# Patient Record
Sex: Female | Born: 1965 | Race: Black or African American | Hispanic: No | Marital: Married | Smoking: Never smoker
Health system: Southern US, Community
[De-identification: ages and names within clinical notes are randomized; demographics above are authoritative.]

---

## 1998-02-04 ENCOUNTER — Inpatient Hospital Stay (HOSPITAL_COMMUNITY): Admission: AD | Admit: 1998-02-04 | Discharge: 1998-02-04 | Payer: Self-pay | Admitting: *Deleted

## 1998-02-07 ENCOUNTER — Ambulatory Visit (HOSPITAL_COMMUNITY): Admission: RE | Admit: 1998-02-07 | Discharge: 1998-02-07 | Payer: Self-pay | Admitting: *Deleted

## 1998-06-10 ENCOUNTER — Inpatient Hospital Stay (HOSPITAL_COMMUNITY): Admission: AD | Admit: 1998-06-10 | Discharge: 1998-06-10 | Payer: Self-pay | Admitting: Obstetrics

## 1998-06-17 ENCOUNTER — Inpatient Hospital Stay (HOSPITAL_COMMUNITY): Admission: AD | Admit: 1998-06-17 | Discharge: 1998-06-17 | Payer: Self-pay | Admitting: *Deleted

## 1998-06-28 ENCOUNTER — Encounter: Payer: Self-pay | Admitting: Obstetrics

## 1998-06-28 ENCOUNTER — Inpatient Hospital Stay (HOSPITAL_COMMUNITY): Admission: AD | Admit: 1998-06-28 | Discharge: 1998-06-28 | Payer: Self-pay | Admitting: Obstetrics

## 1998-07-11 ENCOUNTER — Inpatient Hospital Stay (HOSPITAL_COMMUNITY): Admission: AD | Admit: 1998-07-11 | Discharge: 1998-07-11 | Payer: Self-pay | Admitting: *Deleted

## 1998-07-12 ENCOUNTER — Encounter: Admission: RE | Admit: 1998-07-12 | Discharge: 1998-07-12 | Payer: Self-pay | Admitting: Obstetrics & Gynecology

## 1998-08-09 ENCOUNTER — Encounter: Admission: RE | Admit: 1998-08-09 | Discharge: 1998-08-09 | Payer: Self-pay | Admitting: Obstetrics & Gynecology

## 1998-08-09 ENCOUNTER — Other Ambulatory Visit: Admission: RE | Admit: 1998-08-09 | Discharge: 1998-08-09 | Payer: Self-pay | Admitting: Obstetrics & Gynecology

## 1998-09-05 ENCOUNTER — Ambulatory Visit (HOSPITAL_COMMUNITY): Admission: RE | Admit: 1998-09-05 | Discharge: 1998-09-05 | Payer: Self-pay | Admitting: Obstetrics & Gynecology

## 1998-12-28 ENCOUNTER — Ambulatory Visit (HOSPITAL_COMMUNITY): Admission: RE | Admit: 1998-12-28 | Discharge: 1998-12-28 | Payer: Self-pay | Admitting: *Deleted

## 1999-01-12 ENCOUNTER — Inpatient Hospital Stay (HOSPITAL_COMMUNITY): Admission: AD | Admit: 1999-01-12 | Discharge: 1999-01-12 | Payer: Self-pay | Admitting: Obstetrics & Gynecology

## 1999-01-19 ENCOUNTER — Encounter (HOSPITAL_COMMUNITY): Admission: RE | Admit: 1999-01-19 | Discharge: 1999-01-29 | Payer: Self-pay | Admitting: *Deleted

## 1999-01-25 ENCOUNTER — Inpatient Hospital Stay (HOSPITAL_COMMUNITY): Admission: AD | Admit: 1999-01-25 | Discharge: 1999-01-28 | Payer: Self-pay | Admitting: Obstetrics & Gynecology

## 1999-02-28 ENCOUNTER — Encounter (HOSPITAL_COMMUNITY): Admission: RE | Admit: 1999-02-28 | Discharge: 1999-05-29 | Payer: Self-pay | Admitting: Obstetrics & Gynecology

## 2001-09-16 ENCOUNTER — Encounter: Admission: RE | Admit: 2001-09-16 | Discharge: 2001-09-16 | Payer: Self-pay | Admitting: Family Medicine

## 2001-10-21 ENCOUNTER — Encounter: Admission: RE | Admit: 2001-10-21 | Discharge: 2001-10-21 | Payer: Self-pay | Admitting: Family Medicine

## 2001-12-10 ENCOUNTER — Encounter: Admission: RE | Admit: 2001-12-10 | Discharge: 2001-12-10 | Payer: Self-pay | Admitting: Family Medicine

## 2002-03-15 ENCOUNTER — Emergency Department (HOSPITAL_COMMUNITY): Admission: EM | Admit: 2002-03-15 | Discharge: 2002-03-15 | Payer: Self-pay | Admitting: Emergency Medicine

## 2002-04-15 ENCOUNTER — Encounter: Admission: RE | Admit: 2002-04-15 | Discharge: 2002-04-15 | Payer: Self-pay | Admitting: Family Medicine

## 2002-05-31 ENCOUNTER — Encounter: Admission: RE | Admit: 2002-05-31 | Discharge: 2002-05-31 | Payer: Self-pay | Admitting: Family Medicine

## 2004-03-19 ENCOUNTER — Encounter (INDEPENDENT_AMBULATORY_CARE_PROVIDER_SITE_OTHER): Payer: Self-pay | Admitting: *Deleted

## 2004-03-19 LAB — CONVERTED CEMR LAB

## 2004-04-04 ENCOUNTER — Encounter: Admission: RE | Admit: 2004-04-04 | Discharge: 2004-04-04 | Payer: Self-pay | Admitting: Sports Medicine

## 2004-04-04 ENCOUNTER — Encounter (INDEPENDENT_AMBULATORY_CARE_PROVIDER_SITE_OTHER): Payer: Self-pay | Admitting: Specialist

## 2004-04-12 ENCOUNTER — Encounter: Admission: RE | Admit: 2004-04-12 | Discharge: 2004-04-12 | Payer: Self-pay | Admitting: Sports Medicine

## 2004-04-12 ENCOUNTER — Ambulatory Visit: Payer: Self-pay | Admitting: Sports Medicine

## 2004-11-07 ENCOUNTER — Ambulatory Visit: Payer: Self-pay | Admitting: Family Medicine

## 2005-02-27 ENCOUNTER — Ambulatory Visit: Payer: Self-pay | Admitting: Family Medicine

## 2005-08-15 ENCOUNTER — Ambulatory Visit: Payer: Self-pay | Admitting: Sports Medicine

## 2005-08-15 ENCOUNTER — Encounter (INDEPENDENT_AMBULATORY_CARE_PROVIDER_SITE_OTHER): Payer: Self-pay | Admitting: *Deleted

## 2006-10-17 ENCOUNTER — Encounter (INDEPENDENT_AMBULATORY_CARE_PROVIDER_SITE_OTHER): Payer: Self-pay | Admitting: *Deleted

## 2007-03-23 ENCOUNTER — Ambulatory Visit: Payer: Self-pay | Admitting: Family Medicine

## 2007-03-23 ENCOUNTER — Telehealth: Payer: Self-pay | Admitting: *Deleted

## 2007-03-23 DIAGNOSIS — H9319 Tinnitus, unspecified ear: Secondary | ICD-10-CM | POA: Insufficient documentation

## 2007-04-09 ENCOUNTER — Encounter: Payer: Self-pay | Admitting: *Deleted

## 2008-09-14 ENCOUNTER — Encounter (INDEPENDENT_AMBULATORY_CARE_PROVIDER_SITE_OTHER): Payer: Self-pay | Admitting: Family Medicine

## 2008-09-14 ENCOUNTER — Ambulatory Visit: Payer: Self-pay | Admitting: Family Medicine

## 2008-09-14 DIAGNOSIS — N920 Excessive and frequent menstruation with regular cycle: Secondary | ICD-10-CM

## 2008-09-15 ENCOUNTER — Encounter (INDEPENDENT_AMBULATORY_CARE_PROVIDER_SITE_OTHER): Payer: Self-pay | Admitting: Family Medicine

## 2008-09-15 DIAGNOSIS — D649 Anemia, unspecified: Secondary | ICD-10-CM

## 2008-09-16 LAB — CONVERTED CEMR LAB
HCT: 29 % — ABNORMAL LOW (ref 36.0–46.0)
Hemoglobin: 8.6 g/dL — ABNORMAL LOW (ref 12.0–15.0)
MCHC: 29.7 g/dL — ABNORMAL LOW (ref 30.0–36.0)
MCV: 68.2 fL — ABNORMAL LOW (ref 78.0–100.0)
RBC: 4.25 M/uL (ref 3.87–5.11)
WBC: 5.2 10*3/uL (ref 4.0–10.5)

## 2008-09-19 ENCOUNTER — Ambulatory Visit: Payer: Self-pay | Admitting: Family Medicine

## 2008-09-19 ENCOUNTER — Encounter (INDEPENDENT_AMBULATORY_CARE_PROVIDER_SITE_OTHER): Payer: Self-pay | Admitting: Family Medicine

## 2008-09-19 ENCOUNTER — Encounter: Payer: Self-pay | Admitting: *Deleted

## 2008-09-21 ENCOUNTER — Encounter (INDEPENDENT_AMBULATORY_CARE_PROVIDER_SITE_OTHER): Payer: Self-pay | Admitting: Family Medicine

## 2008-09-21 LAB — CONVERTED CEMR LAB
Retic Ct Pct: 1.3 % (ref 0.4–3.1)
Saturation Ratios: 5 % — ABNORMAL LOW (ref 20–55)

## 2008-09-23 ENCOUNTER — Encounter: Admission: RE | Admit: 2008-09-23 | Discharge: 2008-09-23 | Payer: Self-pay | Admitting: Family Medicine

## 2008-09-27 ENCOUNTER — Encounter: Payer: Self-pay | Admitting: *Deleted

## 2008-09-27 DIAGNOSIS — D259 Leiomyoma of uterus, unspecified: Secondary | ICD-10-CM | POA: Insufficient documentation

## 2009-01-02 ENCOUNTER — Emergency Department (HOSPITAL_COMMUNITY): Admission: EM | Admit: 2009-01-02 | Discharge: 2009-01-02 | Payer: Self-pay | Admitting: Family Medicine

## 2009-12-22 IMAGING — US US PELVIS COMPLETE
1 series · 14 of 25 positions shown · non-contrast
Comparison: None

CLINICAL DATA: Menorrhagia

TRANSABDOMINAL AND TRANSVAGINAL ULTRASOUND OF PELVIS
TECHNIQUE: Both transabdominal and transvaginal ultrasound
examinations of the pelvis were performed including evaluation of
the uterus, ovaries, adnexal regions, and pelvic cul-de-sac.

[Series 1: us pelvis complete · 0.41mm/px · 14 of 80 slices shown]
[im 1/80]
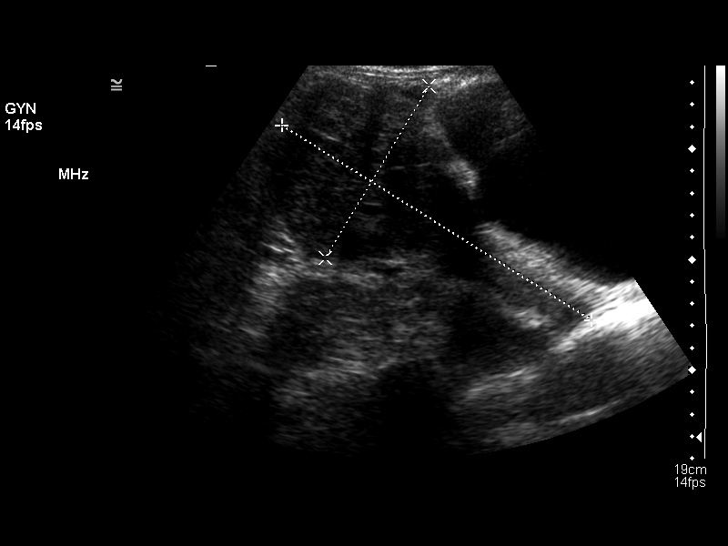
[im 7/80]
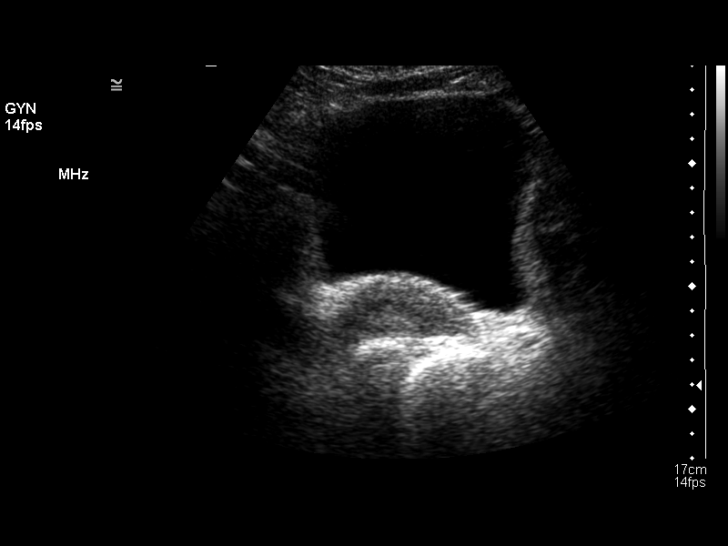
[im 14/80]
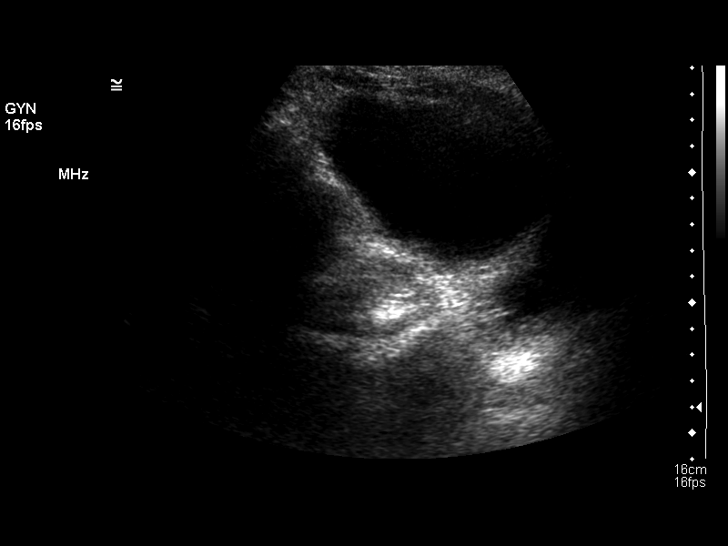
[im 20/80]
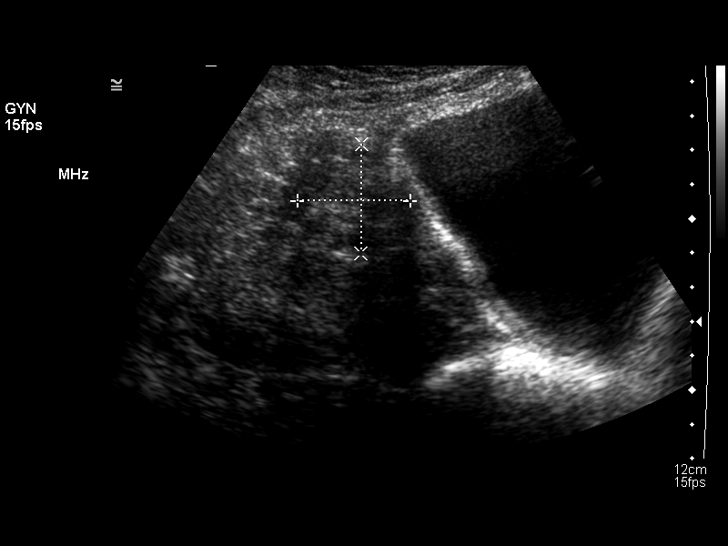
[im 27/80]
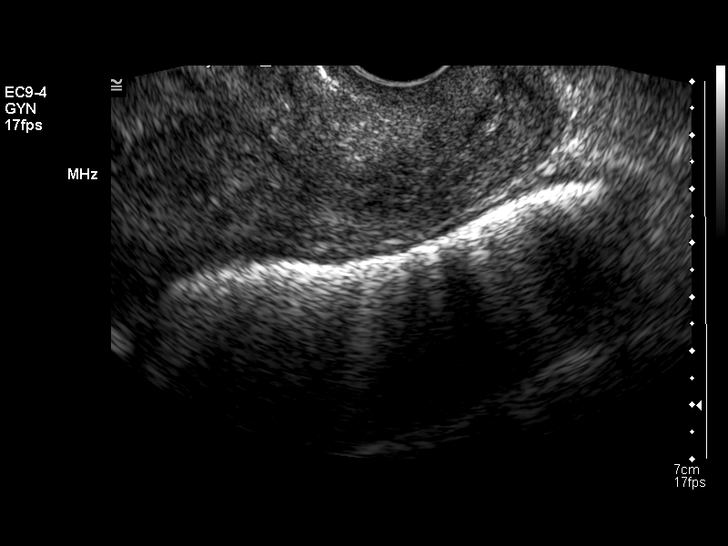
[im 30/80]
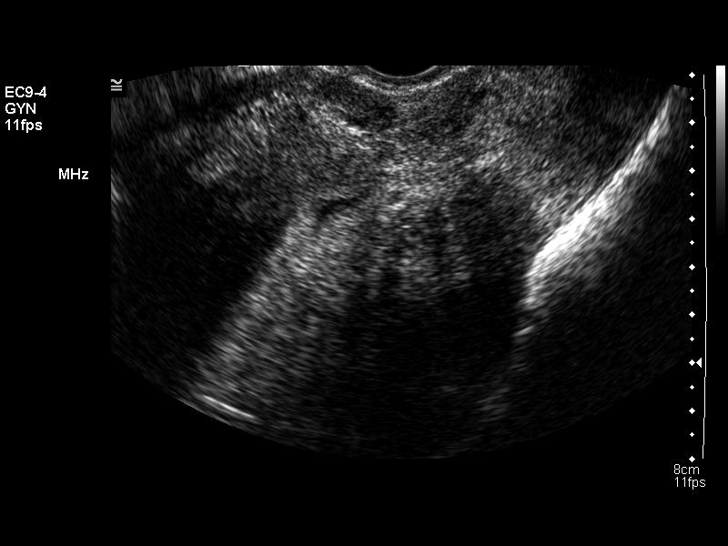
[im 37/80]
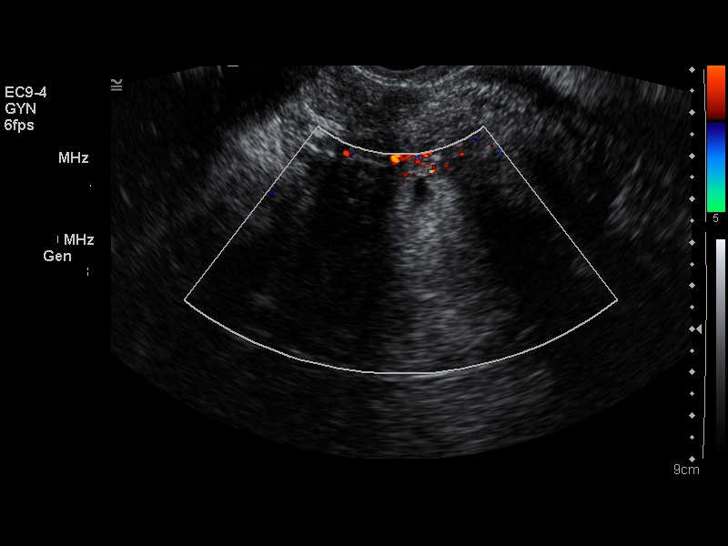
[im 43/80]
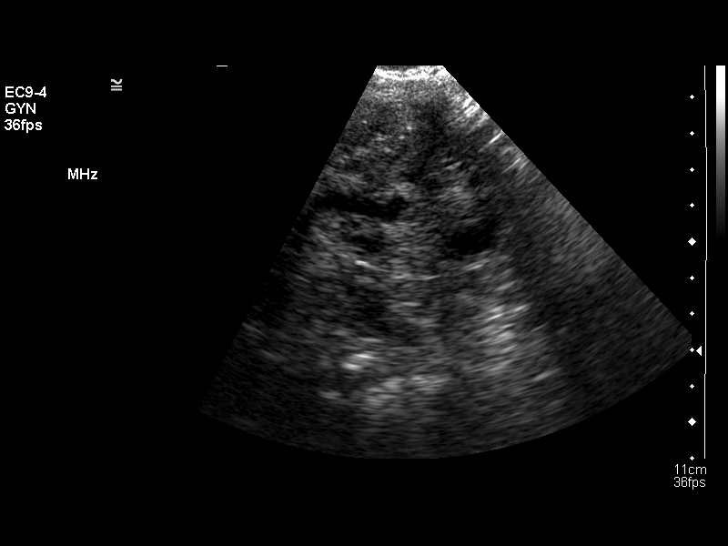
[im 50/80]
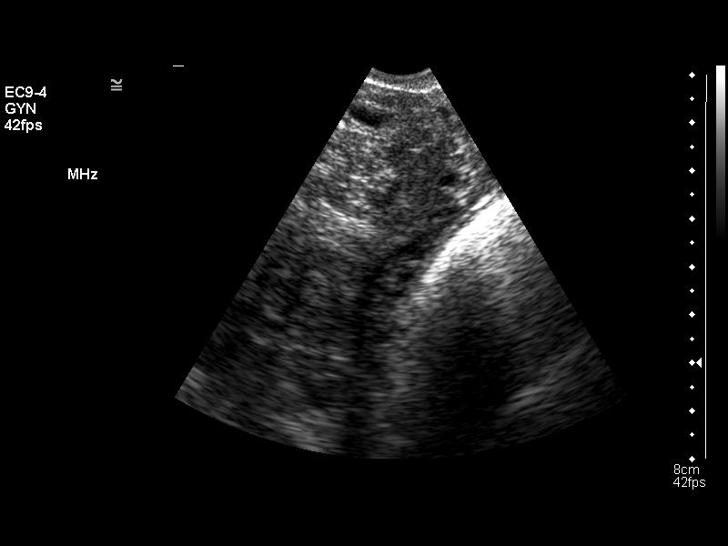
[im 53/80]
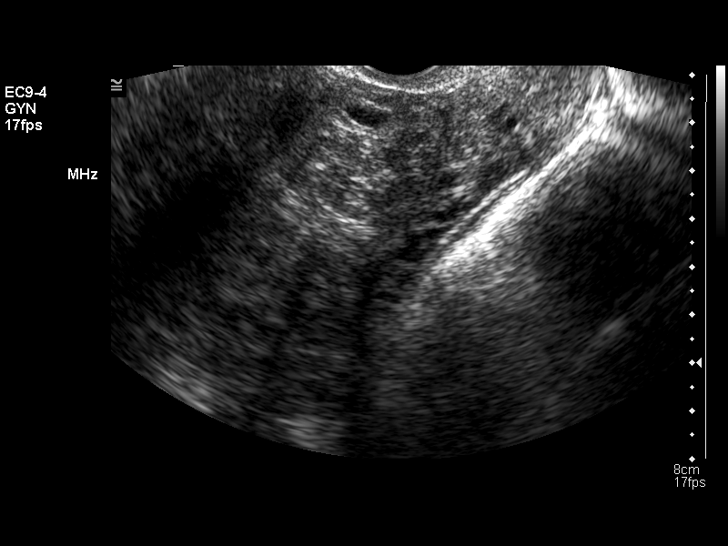
[im 60/80]
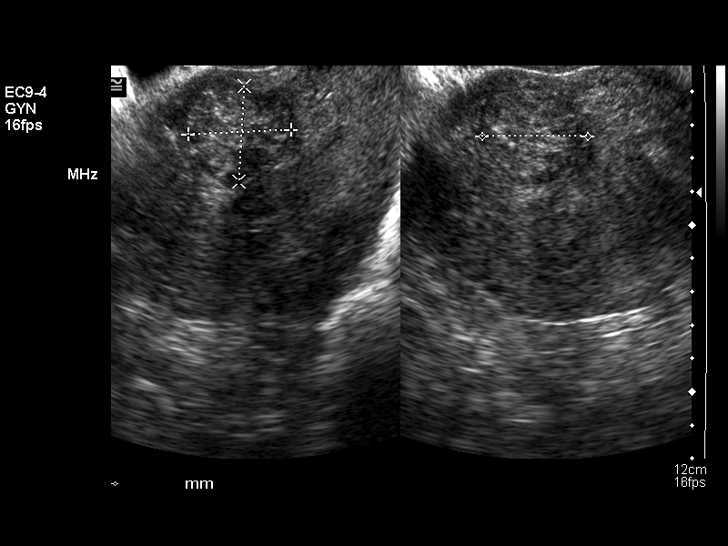
[im 66/80]
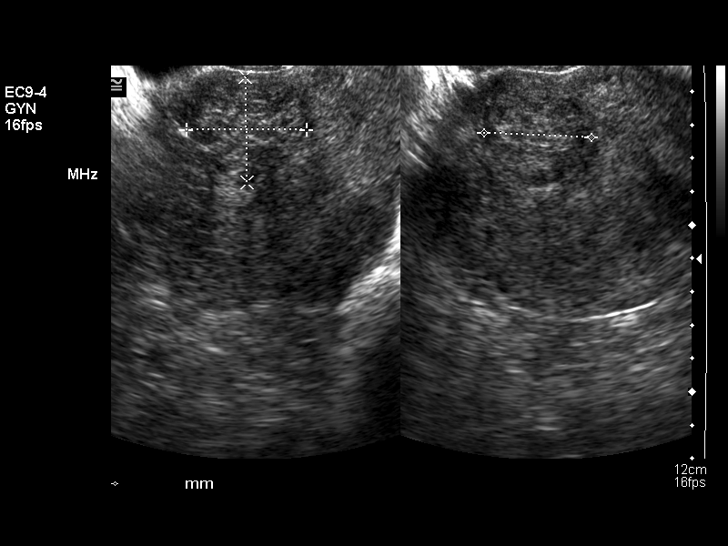
[im 73/80]
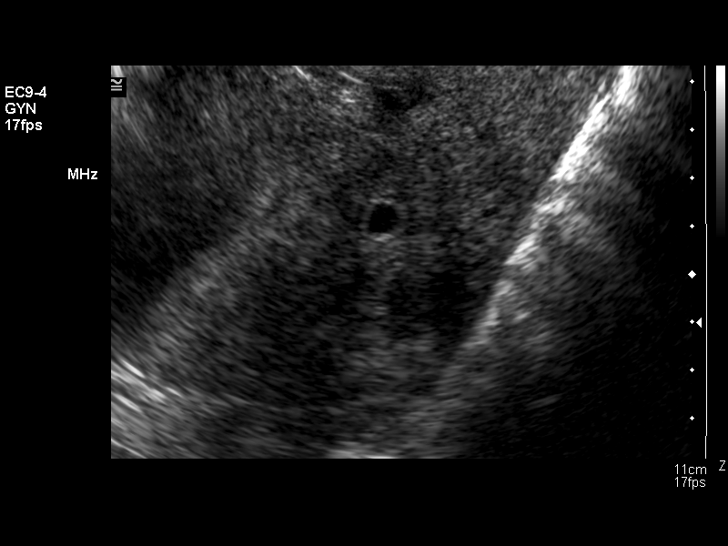
[im 80/80]
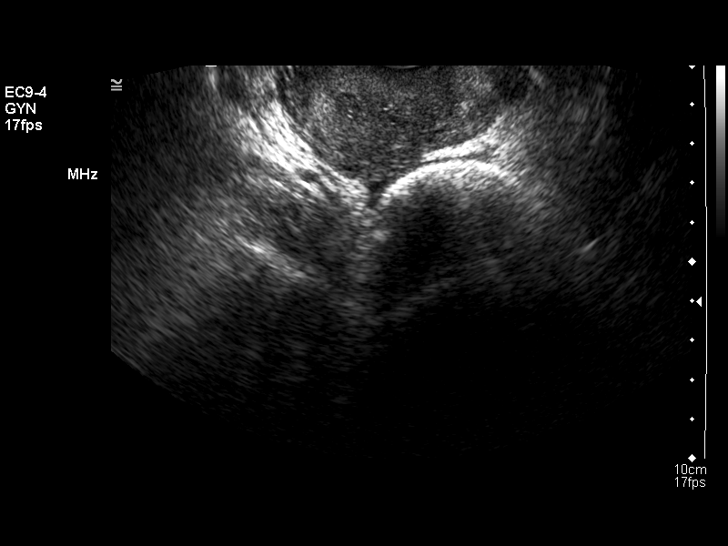

[14 of 25 positions shown; findings below may reference images not displayed]

FINDINGS: Uterus is enlarged, measuring 16.5 x 9.1 x 11.3 cm.
Diffuse heterogeneity noted throughout the endometrium.  Numerous
fibroids present throughout the uterus.  The largest is in the
right lower uterus measuring up to 3.6 cm.  The majority of the
other fibroids are in the 2-3 cm range.  Endometrium upper limits
normal in thickness at 14 mm.

Ovaries are symmetric in size and echotexture.  No adnexal masses.
No free fluid.
IMPRESSION: Enlarged heterogeneous uterus with numerous fibroids as described
above.  The diffuse heterogeneity throughout the uterus also raises
the possibility of adenomyosis.

REF:G3 DICTATED: 09/23/2008 [DATE]

## 2010-01-05 ENCOUNTER — Other Ambulatory Visit: Admission: RE | Admit: 2010-01-05 | Discharge: 2010-01-05 | Payer: Self-pay | Admitting: Obstetrics and Gynecology

## 2010-01-09 ENCOUNTER — Encounter: Admission: RE | Admit: 2010-01-09 | Discharge: 2010-01-09 | Payer: Self-pay | Admitting: Obstetrics and Gynecology

## 2010-01-22 ENCOUNTER — Encounter: Admission: RE | Admit: 2010-01-22 | Discharge: 2010-01-22 | Payer: Self-pay | Admitting: Obstetrics and Gynecology

## 2010-02-27 ENCOUNTER — Ambulatory Visit (HOSPITAL_COMMUNITY): Admission: RE | Admit: 2010-02-27 | Discharge: 2010-02-27 | Payer: Self-pay | Admitting: Diagnostic Radiology

## 2010-03-02 ENCOUNTER — Ambulatory Visit (HOSPITAL_COMMUNITY): Admission: RE | Admit: 2010-03-02 | Discharge: 2010-03-03 | Payer: Self-pay | Admitting: Diagnostic Radiology

## 2010-09-09 ENCOUNTER — Encounter: Payer: Self-pay | Admitting: Diagnostic Radiology

## 2010-09-19 ENCOUNTER — Encounter: Payer: Self-pay | Admitting: *Deleted

## 2010-09-19 ENCOUNTER — Encounter: Payer: Self-pay | Admitting: Obstetrics and Gynecology

## 2010-09-19 ENCOUNTER — Encounter: Payer: Self-pay | Admitting: Diagnostic Radiology

## 2010-11-03 LAB — CBC
HCT: 37.9 % (ref 36.0–46.0)
Hemoglobin: 12.9 g/dL (ref 12.0–15.0)
MCHC: 34 g/dL (ref 30.0–36.0)
WBC: 4.7 10*3/uL (ref 4.0–10.5)

## 2010-11-03 LAB — PREGNANCY, URINE: Preg Test, Ur: NEGATIVE

## 2010-11-04 LAB — CREATININE, SERUM
Creatinine, Ser: 0.63 mg/dL (ref 0.4–1.2)
GFR calc Af Amer: >60 mL/min (ref >60)

## 2011-06-26 ENCOUNTER — Encounter (HOSPITAL_COMMUNITY): Payer: Self-pay | Admitting: *Deleted

## 2011-06-26 ENCOUNTER — Emergency Department (INDEPENDENT_AMBULATORY_CARE_PROVIDER_SITE_OTHER)
Admission: EM | Admit: 2011-06-26 | Discharge: 2011-06-26 | Disposition: A | Discharge status: Home / Self Care | Payer: 59 | Source: Home / Self Care | Attending: Family Medicine | Admitting: Family Medicine

## 2011-06-26 DIAGNOSIS — R232 Flushing: Secondary | ICD-10-CM

## 2011-06-26 DIAGNOSIS — N951 Menopausal and female climacteric states: Secondary | ICD-10-CM

## 2011-06-26 LAB — TSH: TSH: 1.008 u[IU]/mL (ref 0.350–4.500)

## 2011-06-26 LAB — POCT I-STAT, CHEM 8
BUN: 14 mg/dL (ref 6–23)
Calcium, Ion: 1.24 mmol/L (ref 1.12–1.32)
Chloride: 106 mEq/L (ref 96–112)
Glucose, Bld: 88 mg/dL (ref 70–99)
HCT: 38 % (ref 36.0–46.0)
TCO2: 26 mmol/L (ref 0–100)

## 2011-06-26 LAB — T4: T4, Total: 8 ug/dL (ref 5.0–12.5)

## 2011-06-26 NOTE — ED Provider Notes (Signed)
History     CSN: [id number] Arrival date & time: 06/26/2011  4:58 PM   First MD Initiated Contact with Patient 06/26/11 1707      Chief Complaint  Patient presents with  . Chills    pt with c/o chills/loss of appetite /headache/x monday   . Fatigue  . Headache    (Consider location/radiation/quality/duration/timing/severity/associated sxs/prior treatment) HPI Comments: Samantha Lloyd presents for evaluation of headaches, hot flashes, irritability, forgetfulness, dry mouth over the last few months, but worsening over the last few weeks. She is unsure of her last period, but is positive that she had one last month. She reports hot flashes in the morning as she is heading to work, and again at night prior to bedtime. She denies any other symptoms. No nausea, no vomiting, no abdominal pain, no change in bowel or bladder habits, no chest pain, no shortness of breath. No URI sx.   Patient is a 45 y.o. female presenting with headaches. The history is provided by the patient.  Headache The primary symptoms include headaches and memory loss. The symptoms began more than 1 week ago. The symptoms are unchanged.  The headache is not associated with neck stiffness.  Additional symptoms include irritability. Additional symptoms do not include neck stiffness.    History reviewed. No pertinent past medical history.  History reviewed. No pertinent past surgical history.  History reviewed. No pertinent family history.  History  Substance Use Topics  . Smoking status: Not on file  . Smokeless tobacco: Not on file  . Alcohol Use: Not on file    OB History    Grav Para Term Preterm Abortions TAB SAB Ect Mult Living                  Review of Systems  Constitutional: Positive for irritability.  HENT: Negative for neck stiffness.   Neurological: Positive for headaches.  Psychiatric/Behavioral: Positive for memory loss.    Allergies  Review of patient's allergies indicates no known  allergies.  Home Medications   Current Outpatient Rx  Name Route Sig Dispense Refill  . FERROUS SULFATE 325 (65 FE) MG PO TABS Oral Take 325 mg by mouth 2 (two) times daily.        BP 122/83  Pulse 86  Temp(Src) 98.5 F (36.9 C) (Oral)  Resp 18  SpO2 98%  Physical Exam  Constitutional: She is oriented to person, place, and time. She appears well-developed and well-nourished. No distress.  HENT:  Head: Normocephalic and atraumatic.  Eyes: EOM are normal. Pupils are equal, round, and reactive to light.  Neck: Normal range of motion. Neck supple.  Cardiovascular: Normal rate and regular rhythm.   Pulmonary/Chest: Effort normal and breath sounds normal.  Musculoskeletal: Normal range of motion.  Neurological: She is alert and oriented to person, place, and time.  Skin: Skin is warm and dry.  Psychiatric: Her behavior is normal. Thought content normal.    ED Course  Procedures (including critical care time)   Labs Reviewed  POCT I-STAT, CHEM 8  TSH  T4  I-STAT, CHEM 8   No results found.   No diagnosis found.    MDM  Labs reviewed: chem panel normal; thyroid panel pending.         Richardo Priest, MD 06/26/11 [phone]

## 2011-06-27 ENCOUNTER — Ambulatory Visit (INDEPENDENT_AMBULATORY_CARE_PROVIDER_SITE_OTHER): Payer: 59 | Admitting: Family Medicine

## 2011-06-27 ENCOUNTER — Encounter: Payer: Self-pay | Admitting: Family Medicine

## 2011-06-27 VITALS — BP 114/75 | HR 95 | Temp 98.1°F | Ht 63.0 in | Wt 180.0 lb

## 2011-06-27 DIAGNOSIS — R232 Flushing: Secondary | ICD-10-CM | POA: Insufficient documentation

## 2011-06-27 DIAGNOSIS — F329 Major depressive disorder, single episode, unspecified: Secondary | ICD-10-CM

## 2011-06-27 DIAGNOSIS — N951 Menopausal and female climacteric states: Secondary | ICD-10-CM

## 2011-06-27 DIAGNOSIS — Z23 Encounter for immunization: Secondary | ICD-10-CM

## 2011-06-27 DIAGNOSIS — F32A Depression, unspecified: Secondary | ICD-10-CM | POA: Insufficient documentation

## 2011-06-27 MED ORDER — FLUOXETINE HCL 20 MG PO CAPS: 20.0000 mg | ORAL_CAPSULE | Freq: Every day | ORAL | Status: DC

## 2011-06-27 NOTE — Progress Notes (Signed)
  Subjective:    Patient ID: Samantha Lloyd, female    DOB: February 03, 1966, 45 y.o.   MRN: [id number]  HPI 1. Hot flashes  Still having menses. Regular intervals. Normal amount of bleeding. She has a normal TSH and t4 level. No hair loss or fatigue. She does have weight gain, which is expected from lack of exercise and excess calorie intake. No fevers, chills, night sweats. Flashes come randomly and less for minutes. No rash, no recent infection.  2. Grief/Depression PHQ-9 20. No suicidal thoughts.  Patient has recently been off work for some time and is now back at work in a job she doesn't like. She recently separated from her spouse. She feels sadness from these events. She is has no hypersomnia or hyposomnia.    Review of Systems See HPI    Objective:   Physical Exam  Psychiatric: Her speech is normal and behavior is normal. Judgment and thought content normal. Her mood appears anxious. Her affect is labile. Her affect is not angry, not blunt and not inappropriate. Cognition and memory are normal. She exhibits a depressed mood.   Filed Vitals:   06/27/11 1102  BP: 114/75  Pulse: 95  Temp: 98.1 F (36.7 C)  TempSrc: Oral  Height: 5\' 3"  (1.6 m)  Weight: 180 lb (81.647 kg)  Heart - Regular rate and rhythm.  No murmurs, gallops or rubs.    Lungs:  Normal respiratory effort, chest expands symmetrically. Lungs are clear to auscultation, no crackles or wheezes.     Assessment & Plan:

## 2011-06-27 NOTE — Assessment & Plan Note (Signed)
PHq-9 20 No suicidal thoughts Grief related from recent stressors. Grief Counsellor Fluoxetine 20 mg daily. F/u two weeks.

## 2011-06-27 NOTE — Patient Instructions (Signed)
It was great to see you today!  Schedule an appointment to see me in 2 weeks.  Please see a Grief counselor in the next two weeks.

## 2011-06-27 NOTE — Assessment & Plan Note (Signed)
Perimenopausal Fluoxetine 20 mg Daily F/u in two weeks

## 2011-07-17 ENCOUNTER — Ambulatory Visit (INDEPENDENT_AMBULATORY_CARE_PROVIDER_SITE_OTHER): Payer: 59 | Admitting: Family Medicine

## 2011-07-17 DIAGNOSIS — N951 Menopausal and female climacteric states: Secondary | ICD-10-CM

## 2011-07-17 DIAGNOSIS — R232 Flushing: Secondary | ICD-10-CM

## 2011-07-17 DIAGNOSIS — F329 Major depressive disorder, single episode, unspecified: Secondary | ICD-10-CM

## 2011-07-17 MED ORDER — FLUOXETINE HCL 20 MG PO CAPS: 20.0000 mg | ORAL_CAPSULE | Freq: Every day | ORAL | Status: DC

## 2011-07-23 NOTE — Progress Notes (Signed)
  Subjective:    Patient ID: Samantha Lloyd, female    DOB: [birth year], 45 y.o.   MRN: [id number]  HPI 1. Depression:  Here to follow up.  Started on prozac approx 2 weeks ago.  Really likes being on the medication and feels like it is helping her quite a bit, however she often forgets doses and has not taken any over the past two days.  She states her situation at home is better and she has returned back to her spouse.  Unsure if she would like to continue on the medication.     Review of Systems     Objective:   Physical Exam  Constitutional: She appears well-developed and well-nourished. No distress.  Psychiatric: She has a normal mood and affect. Her speech is normal and behavior is normal. Judgment normal. She expresses no homicidal and no suicidal ideation. She expresses no suicidal plans and no homicidal plans.          Assessment & Plan:

## 2011-07-23 NOTE — Assessment & Plan Note (Signed)
Improved, although I'm not sure how much medication is improving her symptoms, since she is missing multiple doses and has not taken in the past two days.  She says she will continue on medication for now.  Will return if symptoms begin to worsen again.

## 2012-10-29 ENCOUNTER — Telehealth: Payer: Self-pay | Admitting: *Deleted

## 2012-10-29 DIAGNOSIS — R232 Flushing: Secondary | ICD-10-CM

## 2012-10-29 NOTE — Telephone Encounter (Signed)
Refill request from pharmacy for Prozac. Form placed in MD box.

## 2012-10-30 MED ORDER — FLUOXETINE HCL 20 MG PO CAPS: 20.0000 mg | ORAL_CAPSULE | Freq: Every day | ORAL | Status: DC

## 2014-07-08 ENCOUNTER — Ambulatory Visit (INDEPENDENT_AMBULATORY_CARE_PROVIDER_SITE_OTHER): Payer: 59 | Admitting: Family Medicine

## 2014-07-08 ENCOUNTER — Ambulatory Visit (INDEPENDENT_AMBULATORY_CARE_PROVIDER_SITE_OTHER): Payer: 59

## 2014-07-08 VITALS — BP 121/66 | HR 85 | Temp 98.1°F | Ht 63.5 in | Wt 193.1 lb

## 2014-07-08 DIAGNOSIS — J069 Acute upper respiratory infection, unspecified: Secondary | ICD-10-CM | POA: Insufficient documentation

## 2014-07-08 DIAGNOSIS — Z23 Encounter for immunization: Secondary | ICD-10-CM

## 2014-07-08 NOTE — Progress Notes (Signed)
   Subjective:    Patient ID: Samantha Lloyd, female    DOB: 11-12-1965, 48 y.o.   MRN: [id number]  HPI Patient presents for SDA, complaint of 3 days of increased chest congestion, nasal mucus and congestion, dry cough and dry mouth.  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  Went to work on [employer], Nov 18th feeling well, but by the end of the day developed sore throat, "sniffles".  Developed worsening nasal congestion since then, worse when laying down.  Also feeling that there's something in her throat, not painful but is like there is something that she cannot swallow or cough up.   Denies fevers or chills, no sweats. Eating less, mostly due to decreased sense of smell (due to congestion) and finds food less appetizing. No hemoptysis. No dysuria or changes in urinary habits. No constipation.   Social Hx: Never-smoker. Not around smokers that she knows of.    Review of Systems     Objective:   Physical Exam Well appearing, no apparent distress HEENT Neck supple, TMs clear with good cones of light bilaterally, no frontal or maxillary sinus tenderness. Clear oropharynx. Watery nasal discharge on mucus membranes of nose. Thyroid supple and non-nodular. No cervical adenopathy noted.  COR Regular S1S2, no extra sounds PULM Clear bilaterally, no rales or wheezes noted.  ABD Soft, nontender, nondistended.        Assessment & Plan:

## 2014-07-08 NOTE — Patient Instructions (Signed)
It was a pleasure to meet you today.  I believe your symptoms are related to a viral illness (cold).   As we discussed, I recommend the following:  1. Increased fluid intake.   2. Mucinex (guiafenesin) 600mg  tablets, take 1 to 2 tablets by mouth every 12 hours as needed (thins mucus, helps with cough).  3. Humidifier or steam from hot shower, to open nasal passages.  4. Nasal saline spray in nose to help clear out nasal passages.   5. Please be in touch if you find your symptoms worsen or do not resolve in the coming 3 to 5 days.

## 2014-09-28 ENCOUNTER — Other Ambulatory Visit (HOSPITAL_COMMUNITY)
Admission: RE | Admit: 2014-09-28 | Discharge: 2014-09-28 | Disposition: A | Discharge status: Home / Self Care | Payer: 59 | Source: Ambulatory Visit | Attending: Family Medicine | Admitting: Family Medicine

## 2014-09-28 ENCOUNTER — Encounter: Payer: Self-pay | Admitting: Family Medicine

## 2014-09-28 ENCOUNTER — Ambulatory Visit (INDEPENDENT_AMBULATORY_CARE_PROVIDER_SITE_OTHER): Payer: 59 | Admitting: Family Medicine

## 2014-09-28 VITALS — BP 121/83 | HR 82 | Temp 98.2°F | Ht 63.5 in | Wt 193.1 lb

## 2014-09-28 DIAGNOSIS — Z1151 Encounter for screening for human papillomavirus (HPV): Secondary | ICD-10-CM | POA: Insufficient documentation

## 2014-09-28 DIAGNOSIS — Z01419 Encounter for gynecological examination (general) (routine) without abnormal findings: Secondary | ICD-10-CM

## 2014-09-28 DIAGNOSIS — Z113 Encounter for screening for infections with a predominantly sexual mode of transmission: Secondary | ICD-10-CM | POA: Insufficient documentation

## 2014-09-28 DIAGNOSIS — Z124 Encounter for screening for malignant neoplasm of cervix: Secondary | ICD-10-CM

## 2014-09-28 DIAGNOSIS — Z0000 Encounter for general adult medical examination without abnormal findings: Secondary | ICD-10-CM

## 2014-09-28 LAB — CBC WITH DIFFERENTIAL/PLATELET
BASOS PCT: 2 % — AB (ref 0–1)
Basophils Absolute: 0.1 10*3/uL (ref 0.0–0.1)
EOS ABS: 0.1 10*3/uL (ref 0.0–0.7)
EOS PCT: 3 % (ref 0–5)
HEMATOCRIT: 38.7 % (ref 36.0–46.0)
Hemoglobin: 12.6 g/dL (ref 12.0–15.0)
Lymphocytes Relative: 40 % (ref 12–46)
Lymphs Abs: 1.6 10*3/uL (ref 0.7–4.0)
MCH: 27.8 pg (ref 26.0–34.0)
MCHC: 32.6 g/dL (ref 30.0–36.0)
MCV: 85.2 fL (ref 78.0–100.0)
MONOS PCT: 6 % (ref 3–12)
MPV: 12.1 fL (ref 8.6–12.4)
Monocytes Absolute: 0.2 10*3/uL (ref 0.1–1.0)
NEUTROS PCT: 49 % (ref 43–77)
Neutro Abs: 1.9 10*3/uL (ref 1.7–7.7)
PLATELETS: 263 10*3/uL (ref 150–400)
RBC: 4.54 MIL/uL (ref 3.87–5.11)
RDW: 14.2 % (ref 11.5–15.5)
WBC: 3.9 10*3/uL — ABNORMAL LOW (ref 4.0–10.5)

## 2014-09-28 LAB — COMPREHENSIVE METABOLIC PANEL
ALK PHOS: 99 U/L (ref 39–117)
ALT: 16 U/L (ref 0–35)
AST: 15 U/L (ref 0–37)
Albumin: 4.4 g/dL (ref 3.5–5.2)
BILIRUBIN TOTAL: 0.4 mg/dL (ref 0.2–1.2)
BUN: 11 mg/dL (ref 6–23)
CO2: 29 mEq/L (ref 19–32)
Calcium: 10.2 mg/dL (ref 8.4–10.5)
Chloride: 105 mEq/L (ref 96–112)
Creat: 0.64 mg/dL (ref 0.50–1.10)
GLUCOSE: 86 mg/dL (ref 70–99)
Potassium: 4.1 mEq/L (ref 3.5–5.3)
Sodium: 142 mEq/L (ref 135–145)
Total Protein: 7.2 g/dL (ref 6.0–8.3)

## 2014-09-28 LAB — HIV ANTIBODY (ROUTINE TESTING W REFLEX): HIV: NONREACTIVE

## 2014-09-28 LAB — RPR

## 2014-09-28 MED ORDER — VENLAFAXINE HCL ER 75 MG PO CP24: 75.0000 mg | ORAL_CAPSULE | Freq: Every day | ORAL | Status: DC

## 2014-09-28 NOTE — Assessment & Plan Note (Signed)
Periods slowing, approaching menopause hs been 6 months since last period labs

## 2014-09-28 NOTE — Assessment & Plan Note (Signed)
Continued hot flashes, would like to try effexor after discussion

## 2014-09-28 NOTE — Assessment & Plan Note (Signed)
Feelings of anhedonia and feelings of depression Try effexor given hot flashes

## 2014-09-28 NOTE — Progress Notes (Addendum)
Patient ID: Samantha Lloyd, female   DOB: 10-02-1965, 49 y.o.   MRN: [id number]   HPI  Patient presents today for well exam and follow-up  Annual exam Has not had but 2 or 3 periods in the last year, last one was in August, normal flow Section active with her husband I would like to be checked for STI's Exline no history of abnormal Pap smears  Depression Positive feelings of anhedonia and feelings of depression, some sleep abnormalities, would like to try medicines PHQ-9 score 13, somewhat difficult, no SI  Hot flashes Continues to have problems with hot flashes  Smoking status noted -  ROS: Per HPI  Objective: BP 121/83 mmHg  Pulse 82  Temp(Src) 98.2 F (36.8 C) (Oral)  Ht 5' 3.5" (1.613 m)  Wt 193 lb 1.6 oz (87.59 kg)  BMI 33.67 kg/m2  LMP  (LMP Unknown) Gen: NAD, alert, cooperative with exam HEENT: NCAT, nares clear BL, oropharynx clear CV: RRR, good S1/S2, no murmur Resp: CTABL, no wheezes, non-labored Abd: SNTND, BS present, no guarding or organomegaly Ext: No edema, warm Neuro: Alert and oriented, No gross deficits  GU: Premium normal appearance, vagina well rugated pink mucous membranes, cervix posterior on bimanual exam and not visualized with speculum, minimal to moderate thick white dc present, bimanual exam with no adnexal fullness or abnormalities palpated, no CMT  Assessment and plan:  Hot flashes Continued hot flashes, would like to try effexor after discussion    Leiomyoma of uterus Periods slowing, approaching menopause hs been 6 months since last period labs   Depression, acute Feelings of anhedonia and feelings of depression Try effexor given hot flashes   Well woman exam Well, no complaints No Hx of STI or abnormal pap Pap today Recommend mammo Labs and HIV    Orders Placed This Encounter  Procedures  . HIV antibody (with reflex)  . RPR  . CBC with Differential  . Comprehensive metabolic panel    Meds ordered this encounter   Medications  . venlafaxine XR (EFFEXOR XR) 75 MG 24 hr capsule    Sig: Take 1 capsule (75 mg total) by mouth daily with breakfast. 1/2 tab daily for first week then 1 pill daily    Dispense:  30 capsule    Refill:  3

## 2014-09-28 NOTE — Patient Instructions (Signed)
Great to see you!  Try out the new medicine, effexor, 1/2 pill daily for 1 week then 1 pill daily   Follow up in 2 weeks.

## 2014-09-28 NOTE — Assessment & Plan Note (Signed)
Well, no complaints No Hx of STI or abnormal pap Pap today Recommend mammo Labs and HIV

## 2014-10-03 LAB — CYTOLOGY - PAP

## 2014-10-08 ENCOUNTER — Telehealth: Payer: Self-pay | Admitting: Family Medicine

## 2014-10-08 NOTE — Telephone Encounter (Signed)
Called to let her know labs and pap smear were all normal.   Laroy Apple, MD [facility] Resident, PGY-3 10/08/2014, 2:18 PM

## 2014-10-25 ENCOUNTER — Ambulatory Visit: Payer: 59 | Admitting: Family Medicine

## 2015-02-24 ENCOUNTER — Encounter: Payer: Self-pay | Admitting: Family Medicine

## 2015-02-24 ENCOUNTER — Ambulatory Visit: Payer: 59 | Admitting: Family Medicine

## 2015-02-24 ENCOUNTER — Ambulatory Visit (INDEPENDENT_AMBULATORY_CARE_PROVIDER_SITE_OTHER): Payer: 59 | Admitting: Family Medicine

## 2015-02-24 VITALS — BP 127/69 | HR 89 | Temp 98.4°F | Wt 192.0 lb

## 2015-02-24 DIAGNOSIS — M545 Low back pain, unspecified: Secondary | ICD-10-CM | POA: Insufficient documentation

## 2015-02-24 MED ORDER — CYCLOBENZAPRINE HCL 10 MG PO TABS: 10.0000 mg | ORAL_TABLET | Freq: Three times a day (TID) | ORAL | Status: DC | PRN
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
## 2015-02-24 NOTE — Patient Instructions (Signed)
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 You can also use Ibuprofen as needed for the pain You can use ice and heat for the pain. You should start getting better by next week, if you are not, please come back in to see [place]. If you have bowel or bladder incontinence, numbness or tingling, or radiation of pain into your legs, please seek medical attention immediately.
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
## 2015-06-27 ENCOUNTER — Ambulatory Visit: Payer: 59 | Admitting: Podiatry

## 2015-07-27 ENCOUNTER — Ambulatory Visit (INDEPENDENT_AMBULATORY_CARE_PROVIDER_SITE_OTHER): Payer: 59 | Admitting: Podiatry

## 2015-07-27 ENCOUNTER — Ambulatory Visit (INDEPENDENT_AMBULATORY_CARE_PROVIDER_SITE_OTHER): Payer: 59

## 2015-07-27 ENCOUNTER — Telehealth: Payer: Self-pay | Admitting: *Deleted

## 2015-07-27 ENCOUNTER — Encounter: Payer: Self-pay | Admitting: Podiatry

## 2015-07-27 VITALS — BP 121/69 | HR 62 | Resp 12

## 2015-07-27 DIAGNOSIS — M2012 Hallux valgus (acquired), left foot: Secondary | ICD-10-CM

## 2015-07-27 DIAGNOSIS — M2042 Other hammer toe(s) (acquired), left foot: Secondary | ICD-10-CM

## 2015-07-27 NOTE — Progress Notes (Signed)
Subjective:    Patient ID: Samantha Lloyd, female    DOB: August 13, 1966, 49 y.o.   MRN: [id number]  HPI: She presents today with chief complaint of a painful forefoot left. She states that over the past several months she has no some worsening of pain around the first metatarsophalangeal joint with particular shoe gear. She states that it really doesn't seem for shoe gear she wears she still has pain certain shoes just result in more discomfort. She states that the there is swelling around the first metatarsophalangeal joint and it is red and painful. She states for last 2 months in particular she's noticed throbbing. She states she show also has some tenderness beneath the second metatarsophalangeal joint and on the third toe where it under laps the second toe. She denies any trauma and states that the under lapping of the toe has been present for many years and bunion just appears to be worsening recently. She states the right foot is similar but not as painful.    Review of Systems  All other systems reviewed and are negative.      Objective:   Physical Exam: 49 year old black female presents vital signs stable alert and oriented 3 in no apparent distress. Pulses are strongly palpable capillary fill time to digits one through 5 of the bilateral foot is immediate. Neurologic sensorium is intact per Semmes-Weinstein monofilament and vibratory sensation is intact. Deep tendon reflexes are brisk and equal bilateral. Muscle strength is +5/5 dorsiflexors plantar flexors and inverters everters all intrinsic musculature is intact. Orthopedic evaluation demonstrates hallux abductovalgus deformity with pain on range of motion of the first metatarsophalangeal joint particularly in range of motion. She also has pain on palpation of the medial dorsal cutaneous nerve as it passes over a very prominent hypertrophic medial condyle of the head of the first metatarsal. There is mild erythema overlying this area.  Mild fluctuance beneath the skin. I see no infection at this point. She also has pain on palpation and in range of motion of the second metatarsophalangeal joint. This toe is excessively long as she does have an under lapping or an adductovarus rotation of the DIPJ third digit left foot. This is moderately tender on palpation and appears to be in flexor contracture at the level of the PIPJ and DIPJ. Radiographic evaluation 3 views bilateral taken today in the office demonstrate hallux abductovalgus deformity bilateral left greater than right with an increase in the first intermetatarsal angle greater than normal value proximally 15 and hallux abductus angle of about 10. An elongated second metatarsal of approximately 1 cm greater than normal value. Adductor varus rotated hammertoe deformity third is noted bilaterally. Cutaneous evaluation demonstrates supple well-hydrated cutis with exception of the mild erythema overlying the first metatarsophalangeal joints bilaterally left seems to be worse than the right. No open lesions or wounds.        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 Plantarflexed elongated second metatarsal with an elongated toe without deformity left greater than right. 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  Plan: We discussed the etiology pathology conservative versus surgical therapies. At this point we consented her for an [facility] bunion repair with screw fixation, second metatarsal osteotomy shortening in nature with double screw fixation. And a hammertoe repair either an arthroplasty or an arthrodesis of the third digit left foot. We went over the consent form line by line number by number giving her ample  time to ask questions she saw fit regarding these procedures. We discussed the possible postop complications which may include but are not limited to postop pain bleeding  swelling infection recurrence overcorrection under correction need for further surgery loss of digit loss of limb loss of life development of a pulmonary embolism or a DVT. She signed all 3 pages of the consent form were discussed with human resources as to how long she can be out of work we did discuss at least 2 weeks at this point. I also suggested she received a [place] for her postop recovery period. I will follow-up with her in the near future.

## 2015-07-27 NOTE — Patient Instructions (Signed)
Pre-Operative Instructions  Congratulations, you have decided to take an important step to improving your quality of life.  You can be assured that the doctors of [facility] will be with you every step of the way.  1. Plan to be at the surgery center/hospital at least 1 (one) hour prior to your scheduled time unless otherwise directed by the surgical center/hospital staff.  You must have a responsible adult accompany you, remain during the surgery and drive you home.  Make sure you have directions to the surgical center/hospital and know how to get there on time. 2. For hospital based surgery you will need to obtain a history and physical form from your family physician within 1 month prior to the date of surgery- we will give you a form for you primary physician.  3. We make every effort to accommodate the date you request for surgery.  There are however, times where surgery dates or times have to be moved.  We will contact you as soon as possible if a change in schedule is required.   4. No Aspirin/Ibuprofen for one week before surgery.  If you are on aspirin, any non-steroidal anti-inflammatory medications (Mobic, Aleve, Ibuprofen) you should stop taking it 7 days prior to your surgery.  You make take Tylenol  For pain prior to surgery.  5. Medications- If you are taking daily heart and blood pressure medications, seizure, reflux, allergy, asthma, anxiety, pain or diabetes medications, make sure the surgery center/hospital is aware before the day of surgery so they may notify you which medications to take or avoid the day of surgery. 6. No food or drink after midnight the night before surgery unless directed otherwise by surgical center/hospital staff. 7. No alcoholic beverages 24 hours prior to surgery.  No smoking 24 hours prior to or 24 hours after surgery. 8. Wear loose pants or shorts- loose enough to fit over bandages, boots, and casts. 9. No slip on shoes, sneakers are best. 10. Bring  your boot with you to the surgery center/hospital.  Also bring crutches or a walker if your physician has prescribed it for you.  If you do not have this equipment, it will be provided for you after surgery. 11. If you have not been contracted by the surgery center/hospital by the day before your surgery, call to confirm the date and time of your surgery. 12. Leave-time from work may vary depending on the type of surgery you have.  Appropriate arrangements should be made prior to surgery with your employer. 13. Prescriptions will be provided immediately following surgery by your doctor.  Have these filled as soon as possible after surgery and take the medication as directed. 14. Remove nail polish on the operative foot. 15. Wash the night before surgery.  The night before surgery wash the foot and leg well with the antibacterial soap provided and water paying special attention to beneath the toenails and in between the toes.  Rinse thoroughly with water and dry well with a towel.  Perform this wash unless told not to do so by your physician.  Enclosed: 1 Ice pack (please put in freezer the night before surgery)   1 Hibiclens skin cleaner   Pre-op Instructions  If you have any questions regarding the instructions, do not hesitate to call our office.  [place]: [address] [place], [place] [address] [phone]  [place]: [address] [place], [place] [address] [phone]  [place]: [address]  [place], [place] [address] [phone]  Dr. Richard   Tuchman DPM, Dr. Norman Regal DPM Dr. Richard Sikora DPM, Dr. M. Todd Taiden Raybourn DPM, Dr. Kathryn Egerton DPM 

## 2015-07-27 NOTE — Telephone Encounter (Signed)
"  I was there today, I'd like to schedule my surgery."  He can do it on 08/18/2015.  "That date will be fine.  Am I in-network with the surgical center or should I call them?"  What insurance do you have?  "I have [employer].  I think they are but you may want to call them to make sure.  "Okay, I will.  Is there anything else I need to do?"  Register with the surgical center, instructions are in the surgical center brochure that we gave you.  They will call you a day or two prior to surgery date with the arrival time.  "Okay, thank you so much."

## 2015-08-14 ENCOUNTER — Other Ambulatory Visit: Payer: Self-pay | Admitting: Podiatry

## 2015-08-14 MED ORDER — CEPHALEXIN 500 MG PO CAPS: 500.0000 mg | ORAL_CAPSULE | Freq: Three times a day (TID) | ORAL | Status: DC

## 2015-08-14 MED ORDER — OXYCODONE-ACETAMINOPHEN 10-325 MG PO TABS: ORAL_TABLET | ORAL | Status: DC

## 2015-08-14 MED ORDER — PROMETHAZINE HCL 25 MG PO TABS: 25.0000 mg | ORAL_TABLET | Freq: Three times a day (TID) | ORAL | Status: DC | PRN

## 2015-08-17 ENCOUNTER — Telehealth: Payer: Self-pay | Admitting: *Deleted

## 2015-08-17 NOTE — Telephone Encounter (Signed)
"  I was told I needed to call you by the surgical center about my surgery scheduled for tomorrow.  They called and told me to be there at 7am.  I can't be there that early.  At first I was scheduled for 3pm and they changed that time.  Then I received a call and they changed it to 12:30pm.  Do they not want to do my surgery?"  No, Dr. Milinda Pointer just wanted to do the bigger cases first.  "Oh no, why did you tell me that?  Now I am worried.  Why would he do that, why are the other cases not as long as mine?  I'm getting scared!  "No, they're having different procedures or only one procedure and doesn't take as long.  I'll call them and tell them to leave you where you are at.  "Will they call me back with the time?"  Yes, I will call them and let them know.    I called and spoke to [place] at [facility].  I asked her to leave patient where she is and to call and let her know.

## 2015-08-17 NOTE — Telephone Encounter (Signed)
"  I want to cancel my surgery scheduled for tomorrow.  I think I"m going to wait until March when my daughter will be home on Spring Break."  Would you like to reschedule it?  "I do but I'm not sure of the date.  I will call you back when it gets closer to March.

## 2015-08-17 NOTE — Telephone Encounter (Signed)
These cancellations totally screwed up my surgery schedule.  I could have done all of these on Friday and not had to miss clinic.  Maybe we should impose a cancellation fee if it is last minute.  She should have known that she was not going to do this before today.  Thanks Delydia.

## 2015-08-18 ENCOUNTER — Telehealth: Payer: Self-pay | Admitting: *Deleted

## 2015-08-18 NOTE — Telephone Encounter (Signed)
"  I thought about it all night.  Is there anyway possible I can still have my surgery done?  If I have it done now it'll probably be healed by March."  I don't know, I'll have to call the surgical center to find out.  I'll call you back.  I attempted to call Renee at [facility].  She had not arrived yet.  I will call her back.  "This is Renee.  I got your message.  Dr. Milinda Pointer said no, it can't be done today.  We have shredded all her information so if she reschedules, can you send me the information again?"  Okay, I will.  Unfortunately they are not able to add you back on for today at the surgical center.  "Okay, I can't even get mad.  It is my fault.  What is the soonest he has available?"  He has an availability on 09/01/2015.  "Okay, I'll can't do it then.  I'll just call back to reschedule."

## 2015-08-24 ENCOUNTER — Encounter: Payer: Self-pay | Admitting: Podiatry

## 2015-08-31 ENCOUNTER — Encounter: Payer: Self-pay | Admitting: Podiatry

## 2015-12-22 ENCOUNTER — Encounter: Payer: Self-pay | Admitting: Family Medicine

## 2015-12-22 ENCOUNTER — Ambulatory Visit (INDEPENDENT_AMBULATORY_CARE_PROVIDER_SITE_OTHER): Payer: 59 | Admitting: Family Medicine

## 2015-12-22 VITALS — BP 112/73 | HR 85 | Temp 98.3°F | Ht 64.0 in | Wt 169.0 lb

## 2015-12-22 DIAGNOSIS — R634 Abnormal weight loss: Secondary | ICD-10-CM | POA: Diagnosis not present

## 2015-12-22 DIAGNOSIS — L918 Other hypertrophic disorders of the skin: Secondary | ICD-10-CM | POA: Diagnosis not present

## 2015-12-22 LAB — BASIC METABOLIC PANEL WITH GFR
BUN: 10 mg/dL (ref 7–25)
CALCIUM: 10.7 mg/dL — AB (ref 8.6–10.4)
CO2: 27 mmol/L (ref 20–31)
CREATININE: 0.72 mg/dL (ref 0.50–1.05)
Chloride: 103 mmol/L (ref 98–110)
GFR, Est African American: >89 mL/min (ref >=60)
GLUCOSE: 88 mg/dL (ref 65–99)
Potassium: 4.3 mmol/L (ref 3.5–5.3)
Sodium: 142 mmol/L (ref 135–146)

## 2015-12-22 LAB — LIPID PANEL
Cholesterol: 209 mg/dL — ABNORMAL HIGH (ref 125–200)
HDL: 54 mg/dL (ref >=46)
LDL CALC: 133 mg/dL — AB (ref <130)
TRIGLYCERIDES: 108 mg/dL (ref <150)
Total CHOL/HDL Ratio: 3.9 Ratio (ref <=5.0)
VLDL: 22 mg/dL (ref <30)

## 2015-12-22 LAB — TSH: TSH: 0.81 mIU/L

## 2015-12-22 NOTE — Assessment & Plan Note (Signed)
Patient has had some significant loss of weight over the past few months. She states that she has been trying to please weight recently but was wondering if she may be losing weight too fast. At this time I do not believe that she is losing weight at an unhealthy rate, has long as she is making an effort to lose that weight. - I will obtain some labs to ensure that there is no underlying problem at this time: CMP, TSH, lipid panel.

## 2015-12-22 NOTE — Assessment & Plan Note (Signed)
Patient had multiple skin tags noted on her upper chest and neck. These were removed as stated in the procedure note above. - Patient tolerated procedure well - Return precautions and signs of infection discussed with patient.

## 2015-12-22 NOTE — Progress Notes (Signed)
   HPI  CC: Weight loss and skin tag removal. Patient is here for a skin tag removal. She states that she has had some issues with some skin tags noted around her bra line which can occasionally get red and irritated, as well as a skin tag underneath her left arm which occasionally gets nicked when shaving and bleeds. She denies any bleeding or irritation to these sites at this time. She denies having any allergic reactions to any medications that she knows of.  Patient also states that she has had a significant amount of weight loss over the past year. She states that she has indeed been  Attempting to lose weight and is now down over 30 pounds. She is asking whether or not this is a safe amount of weight to of lost because she would like to continue in her attempts to lose more weight. She denies any anorexia or fatigue. She states that she has been significantly more active than she had been in the past. She works out about 5 days a week. She has altered her diet to be more healthy and balanced. She denies having any issues during any of her workouts. Ultimately, she states that she feels much better than she did this time last year.  ROS: Denies headache,vision changes, dizziness, vertigo, shortness of breath, chest pain, nausea, vomiting, diarrhea, dysuria, weakness, numbness, or paresthesias.  CC, SH/smoking status, and VS noted  Objective: BP 112/73 mmHg  Pulse 85  Temp(Src) 98.3 F (36.8 C) (Oral)  Ht 5\' 4"  (1.626 m)  Wt 169 lb (76.658 kg)  BMI 28.99 kg/m2 Gen: NAD, alert, cooperative, and pleasant. HEENT: NCAT, EOMI, PERRL CV: RRR, no murmur Resp:  non-labored Abd: SNTND, BS present, no guarding or organomegaly Integument: Multiple <4mm pedunculated, hyperpigmented skin lesions noted along her upper chest. No areas of erythema, drainage or bleeding.  Procedure note: skin tag removal  Location: under left breast (x4), left clavicle/upper chest (x2), left axillary (x1), Rt base of  neck (x4) Written informed consent given and received, including potential for scarring.  Area cleaned with alcohol swabs x 2, and then injected with 2% plain lidocaine with epinephrine.  Adequate anesthesia achieved.  Skin tags snipped with suture scissors.  Minimal bleeding.  Area covered with Band-Aid and steri strip .  Patient tolerated procedure very well   Assessment and plan:  Acrochordon Patient had multiple skin tags noted on her upper chest and neck. These were removed as stated in the procedure note above. - Patient tolerated procedure well - Return precautions and signs of infection discussed with patient.  Loss of weight Patient has had some significant loss of weight over the past few months. She states that she has been trying to please weight recently but was wondering if she may be losing weight too fast. At this time I do not believe that she is losing weight at an unhealthy rate, has long as she is making an effort to lose that weight. - I will obtain some labs to ensure that there is no underlying problem at this time: CMP, TSH, lipid panel.    Orders Placed This Encounter  Procedures  . BASIC METABOLIC PANEL WITH GFR  . TSH  . Lipid panel    Elberta Leatherwood, MD,MS,  PGY2 12/22/2015 5:57 PM

## 2015-12-22 NOTE — Patient Instructions (Signed)
Incision Care °An incision is when a surgeon cuts into your body. After surgery, the incision needs to be cared for properly to prevent infection.  °HOW TO CARE FOR YOUR INCISION °· Take medicines only as directed by your health care provider. °· There are many different ways to close and cover an incision, including stitches, skin glue, and adhesive strips. Follow your health care provider's instructions on: °¨ Incision care. °¨ Bandage (dressing) changes and removal. °¨ Incision closure removal. °· Do not take baths, swim, or use a hot tub until your health care provider approves. You may shower as directed by your health care provider. °· Resume your normal diet and activities as directed. °· Use anti-itch medicine (such as an antihistamine) as directed by your health care provider. The incision may itch while it is healing. Do not pick or scratch at the incision. °· Drink enough fluid to keep your urine clear or pale yellow. °SEEK MEDICAL CARE IF:  °· You have drainage, redness, swelling, or pain at your incision site. °· You have muscle aches, chills, or a general ill feeling. °· You notice a bad smell coming from the incision or dressing. °· Your incision edges separate after the sutures, staples, or skin adhesive strips have been removed. °· You have persistent nausea or vomiting. °· You have a fever. °· You are dizzy. °SEEK IMMEDIATE MEDICAL CARE IF:  °· You have a rash. °· You faint. °· You have difficulty breathing. °MAKE SURE YOU:  °· Understand these instructions. °· Will watch your condition. °· Will get help right away if you are not doing well or get worse. °  °This information is not intended to replace advice given to you by your health care provider. Make sure you discuss any questions you have with your health care provider. °  °Document Released: 02/22/2005 Document Revised: 08/26/2014 Document Reviewed: 09/29/2013 °Elsevier Interactive Patient Education ©2016 Elsevier Inc. ° °

## 2015-12-26 ENCOUNTER — Telehealth: Payer: Self-pay | Admitting: Family Medicine

## 2015-12-26 NOTE — Telephone Encounter (Signed)
Need to get information emailed to patient regarding her mammogrm.  Send to : [email or web address]

## 2015-12-27 ENCOUNTER — Encounter: Payer: Self-pay | Admitting: Family Medicine

## 2015-12-27 NOTE — Telephone Encounter (Signed)
Unable to email, mammogram form mailed to address in patient chart.

## 2016-01-03 ENCOUNTER — Other Ambulatory Visit: Payer: Self-pay

## 2016-01-03 DIAGNOSIS — Z1231 Encounter for screening mammogram for malignant neoplasm of breast: Secondary | ICD-10-CM

## 2016-01-19 ENCOUNTER — Other Ambulatory Visit: Payer: Self-pay | Admitting: Family Medicine

## 2016-01-19 ENCOUNTER — Ambulatory Visit
Admission: RE | Admit: 2016-01-19 | Discharge: 2016-01-19 | Disposition: A | Discharge status: Home / Self Care | Payer: 59 | Source: Ambulatory Visit

## 2016-01-19 DIAGNOSIS — Z1231 Encounter for screening mammogram for malignant neoplasm of breast: Secondary | ICD-10-CM

## 2016-02-21 ENCOUNTER — Ambulatory Visit (INDEPENDENT_AMBULATORY_CARE_PROVIDER_SITE_OTHER): Payer: 59 | Admitting: Internal Medicine

## 2016-02-21 ENCOUNTER — Encounter: Payer: Self-pay | Admitting: Internal Medicine

## 2016-02-21 VITALS — BP 102/60 | HR 72 | Temp 97.9°F | Wt 160.6 lb

## 2016-02-21 DIAGNOSIS — M25512 Pain in left shoulder: Secondary | ICD-10-CM | POA: Diagnosis not present

## 2016-02-21 DIAGNOSIS — M25519 Pain in unspecified shoulder: Secondary | ICD-10-CM | POA: Insufficient documentation

## 2016-02-21 NOTE — Patient Instructions (Signed)
Try not to do aggressive exercise with the left shoulder. Continue with Ibuprofen and you can also try Aleve. Ice the area and do gentle stretches after icing to prevent frozen shoulder.   I suspect that your shoulder pain will get better with time as inflammation decreases.   However, if your pain is exactly the same or worse in 2 weeks please return to the clinic.   Take Care,   Dr. Juleen China

## 2016-02-21 NOTE — Assessment & Plan Note (Signed)
Suspect tendonitis related to acute injury of shoulder worsened by aggravation related to persistent exercise. Doubt fracture or large rotator cuff tear as patient has full ROM in left upper extremity. No signs of adhesive capsulitis.  -rest from exercise but continued gentle stretching/movement to prevent frozen shoulder  -discussed that patient can take more ibuprofen than current dose or try other OTCs NSAIDs to decrease inflammation -encouraged ice to the area  -return in 2 weeks if pain no better or worse  -patient declined Toradol injection  -if pain persists, may benefit from steroid injection

## 2016-02-21 NOTE — Progress Notes (Signed)
   Subjective:    Samantha Lloyd - 50 y.o. female MRN [id number]  Date of birth: [birth year]  HPI  Samantha Lloyd [id number] is here for anterior left shoulder pain. Started about 1-2 weeks ago. Thinks she injured her shoulder grabbing her daughter who was "running her mouth". Feels ROM is limited in terms of being able to get her shoulder over her head. Patient does not believe she has any swelling in her left arm compared to right. Has been taking ibuprofen for the pain. Takes between 4-6 200 mg tablets of Ibuprofen per day. Has been going to the gym and using the Elliptical with arm movement and has been using weights.   Night pain? Yes. Notices same aching while laying on that shoulder.   Trauma? Yes.   Overhead pain? Yes. But improving.  Instability? No. Numbness/Weakness? No.  -  reports that she has never smoked. She does not have any smokeless tobacco history on file. - Review of Systems: Per HPI. - Past Medical History: Patient Active Problem List   Diagnosis Date Noted  . Pain in joint, shoulder region 02/21/2016  . Acrochordon 12/22/2015  . Loss of weight 12/22/2015  . Lumbago 02/24/2015  . Well woman exam 09/28/2014  . Viral upper respiratory infection 07/08/2014  . Depression, acute 06/27/2011  . Hot flashes 06/27/2011  . Leiomyoma of uterus 09/27/2008   - Medications: reviewed and updated    Objective:   Physical Exam BP 102/60 mmHg  Pulse 72  Temp(Src) 97.9 F (36.6 C) (Oral)  Wt 160 lb 9.6 oz (72.848 kg)  SpO2 100%  LMP  (LMP Unknown) Gen: NAD, alert, cooperative with exam, well-appearing MSK Right shoulder without TTP, deformity or bruising. No appreciable swelling of left compared to right.  Inspection reveals no abnormalities, atrophy or asymmetry. Palpation reveals tenderness over the left anterior glenohumeral region. ROM intact passively and actively.  No signs of impingement with negative Neer and Hawkin's tests, empty can. No painful arc and no drop arm  sign. Neuro: Strength in upper extremities 5/5 bilaterally. Sensation to UE grossly intact.       Assessment & Plan:   Pain in joint, shoulder region Suspect tendonitis related to acute injury of shoulder worsened by aggravation related to persistent exercise. Doubt fracture or large rotator cuff tear as patient has full ROM in left upper extremity. No signs of adhesive capsulitis.  -rest from exercise but continued gentle stretching/movement to prevent frozen shoulder  -discussed that patient can take more ibuprofen than current dose or try other OTCs NSAIDs to decrease inflammation -encouraged ice to the area  -return in 2 weeks if pain no better or worse  -patient declined Toradol injection  -if pain persists, may benefit from steroid injection      Phill Myron, D.O. 02/21/2016, 9:36 AM PGY-2, [place]

## 2016-03-29 ENCOUNTER — Telehealth: Payer: Self-pay | Admitting: *Deleted

## 2016-03-29 NOTE — Telephone Encounter (Signed)
Pt states she would like to know if Dr. Milinda Pointer performed minimal invasive bunion surgery.  I told pt that the incision was often 1 1/2 - 2" long and along the shadow side of the bone to decrease visibility of the scaring, and the incision is only long enough to allow the surgery site to be viewed and worked on properly. I told pt Dr. Milinda Pointer would like her to come in and discuss her concerns with him. Pt states she was to have surgery 07/2015, but did not. I told her she would need to come in to review and sign the consents again and pt states he thinks she only wants the bunion done.  I transferred to schedulers.

## 2016-04-11 ENCOUNTER — Ambulatory Visit (INDEPENDENT_AMBULATORY_CARE_PROVIDER_SITE_OTHER): Payer: 59

## 2016-04-11 ENCOUNTER — Encounter: Payer: Self-pay | Admitting: Podiatry

## 2016-04-11 ENCOUNTER — Ambulatory Visit (INDEPENDENT_AMBULATORY_CARE_PROVIDER_SITE_OTHER): Payer: 59 | Admitting: Podiatry

## 2016-04-11 DIAGNOSIS — M2010 Hallux valgus (acquired), unspecified foot: Secondary | ICD-10-CM

## 2016-04-11 NOTE — Progress Notes (Signed)
She presents today for surgical consult regarding her left foot. She denies fever chills nausea vomiting muscle aches and pains denies any changes in the left foot other than increased soreness about the first metatarsophalangeal joint. She is one scheduled for surgical correction but since decided not to have the surgery. Time has passed and pain has worsened she presents today for surgical consult.  Objective: Vital signs are stable she is alert and oriented 3 she has no change in her past medical history medications or allergies. Radiographs taken today do demonstrate an increase in the first intermetatarsal angle with hallux abductus and dislocation of the first metatarsophalangeal joint left greater than that of the right. She has strong palpable pulses no calf pain tenderness on palpation of the left foot.  Assessment: Pain in limb secondary to hallux abductovalgus deformity of the left foot.  Plan: Discussed etiology pathology conservative versus surgical therapies. We consented her today for an [facility] bunion repair left foot I answered all the questions regarding this procedure to progressive viability in layman's terms. She understood this was amenable to assign O3 pages of the consent form. I will follow up with her in the near future for surgical intervention. She already retains her Cam Gilford Rile she will notify us with questions or concerns.

## 2016-04-11 NOTE — Patient Instructions (Signed)
Pre-Operative Instructions  Congratulations, you have decided to take an important step to improving your quality of life.  You can be assured that the doctors of [facility] will be with you every step of the way.  1. Plan to be at the surgery center/hospital at least 1 (one) hour prior to your scheduled time unless otherwise directed by the surgical center/hospital staff.  You must have a responsible adult accompany you, remain during the surgery and drive you home.  Make sure you have directions to the surgical center/hospital and know how to get there on time. 2. For hospital based surgery you will need to obtain a history and physical form from your family physician within 1 month prior to the date of surgery- we will give you a form for you primary physician.  3. We make every effort to accommodate the date you request for surgery.  There are however, times where surgery dates or times have to be moved.  We will contact you as soon as possible if a change in schedule is required.   4. No Aspirin/Ibuprofen for one week before surgery.  If you are on aspirin, any non-steroidal anti-inflammatory medications (Mobic, Aleve, Ibuprofen) you should stop taking it 7 days prior to your surgery.  You make take Tylenol  For pain prior to surgery.  5. Medications- If you are taking daily heart and blood pressure medications, seizure, reflux, allergy, asthma, anxiety, pain or diabetes medications, make sure the surgery center/hospital is aware before the day of surgery so they may notify you which medications to take or avoid the day of surgery. 6. No food or drink after midnight the night before surgery unless directed otherwise by surgical center/hospital staff. 7. No alcoholic beverages 24 hours prior to surgery.  No smoking 24 hours prior to or 24 hours after surgery. 8. Wear loose pants or shorts- loose enough to fit over bandages, boots, and casts. 9. No slip on shoes, sneakers are best. 10. Bring  your boot with you to the surgery center/hospital.  Also bring crutches or a walker if your physician has prescribed it for you.  If you do not have this equipment, it will be provided for you after surgery. 11. If you have not been contracted by the surgery center/hospital by the day before your surgery, call to confirm the date and time of your surgery. 12. Leave-time from work may vary depending on the type of surgery you have.  Appropriate arrangements should be made prior to surgery with your employer. 13. Prescriptions will be provided immediately following surgery by your doctor.  Have these filled as soon as possible after surgery and take the medication as directed. 14. Remove nail polish on the operative foot. 15. Wash the night before surgery.  The night before surgery wash the foot and leg well with the antibacterial soap provided and water paying special attention to beneath the toenails and in between the toes.  Rinse thoroughly with water and dry well with a towel.  Perform this wash unless told not to do so by your physician.  Enclosed: 1 Ice pack (please put in freezer the night before surgery)   1 Hibiclens skin cleaner   Pre-op Instructions  If you have any questions regarding the instructions, do not hesitate to call our office.  [place]: [address] [place], [place] [address] [phone]  [place]: [address] [place], [place] [address] [phone]  [place]: [address]  [place],  [address] [phone]   Dr.   Norman Regal DPM, Dr. Matthew Wagoner DPM, Dr. M. Todd Sebrena Engh DPM, Dr. Titorya Stover DPM 

## 2016-04-29 ENCOUNTER — Telehealth: Payer: Self-pay | Admitting: *Deleted

## 2016-04-29 NOTE — Telephone Encounter (Signed)
"  I want to reschedule my surgery.  I just started a new job.  Will have time accumulated to take off by then.  I would like to do it sometime in the earlier part of December."  He can do it on December 1st, 8th or 15th.  "Let's do it on December 8."  I will get it scheduled

## 2016-05-17 ENCOUNTER — Other Ambulatory Visit: Payer: Self-pay

## 2016-06-10 ENCOUNTER — Ambulatory Visit: Payer: 59 | Admitting: Internal Medicine

## 2016-07-09 ENCOUNTER — Telehealth: Payer: Self-pay | Admitting: *Deleted

## 2016-07-09 NOTE — Telephone Encounter (Signed)
"  I have a new phone number and address.  My address is [address], Zip [place] [id number].  I am scheduled for surgery I think on December 8"  Okay, I will get everything updated.  I called and informed Samantha Lloyd at [facility] of the change.  I updated her demographics.

## 2016-07-24 ENCOUNTER — Telehealth: Payer: Self-pay | Admitting: *Deleted

## 2016-07-24 ENCOUNTER — Other Ambulatory Visit: Payer: Self-pay | Admitting: Podiatry

## 2016-07-24 MED ORDER — CEPHALEXIN 500 MG PO CAPS: 500.0000 mg | ORAL_CAPSULE | Freq: Three times a day (TID) | ORAL | 0 refills | Status: DC

## 2016-07-24 MED ORDER — PROMETHAZINE HCL 25 MG PO TABS: 25.0000 mg | ORAL_TABLET | Freq: Three times a day (TID) | ORAL | 0 refills | Status: DC | PRN

## 2016-07-24 MED ORDER — OXYCODONE-ACETAMINOPHEN 10-325 MG PO TABS: ORAL_TABLET | ORAL | 0 refills | Status: DC

## 2016-07-24 NOTE — Telephone Encounter (Signed)
"  Is it possible for me to move my surgery from this Friday to December 22?"  Dr. Milinda Pointer does not have anything available on December 22.  "Does he have any other time available?"  He can do it in January.  "I can't do that.  I may have to cancel.  I'll call you back before Friday and let you know for sure."

## 2016-07-26 NOTE — Telephone Encounter (Signed)
Patient called surgical center and rescheduled surgery from 07/26/2016 to 08/15/2016 per Caren Griffins from [facility].

## 2016-08-07 ENCOUNTER — Other Ambulatory Visit: Payer: Self-pay | Admitting: Podiatry

## 2016-08-07 MED ORDER — OXYCODONE-ACETAMINOPHEN 10-325 MG PO TABS: ORAL_TABLET | ORAL | 0 refills | Status: DC

## 2016-08-07 MED ORDER — CEPHALEXIN 500 MG PO CAPS: 500.0000 mg | ORAL_CAPSULE | Freq: Three times a day (TID) | ORAL | 0 refills | Status: DC

## 2016-08-07 MED ORDER — PROMETHAZINE HCL 25 MG PO TABS: 25.0000 mg | ORAL_TABLET | Freq: Three times a day (TID) | ORAL | 0 refills | Status: DC | PRN

## 2016-08-08 ENCOUNTER — Encounter: Payer: Self-pay | Admitting: Podiatry

## 2016-08-08 DIAGNOSIS — M2012 Hallux valgus (acquired), left foot: Secondary | ICD-10-CM | POA: Diagnosis not present

## 2016-08-08 DIAGNOSIS — M2042 Other hammer toe(s) (acquired), left foot: Secondary | ICD-10-CM | POA: Diagnosis not present

## 2016-08-15 ENCOUNTER — Encounter: Payer: Self-pay | Admitting: Podiatry

## 2016-08-15 ENCOUNTER — Ambulatory Visit (INDEPENDENT_AMBULATORY_CARE_PROVIDER_SITE_OTHER): Payer: 59

## 2016-08-15 ENCOUNTER — Ambulatory Visit (INDEPENDENT_AMBULATORY_CARE_PROVIDER_SITE_OTHER): Payer: 59 | Admitting: Podiatry

## 2016-08-15 VITALS — BP 94/58 | HR 80 | Resp 16

## 2016-08-15 DIAGNOSIS — M2012 Hallux valgus (acquired), left foot: Secondary | ICD-10-CM | POA: Diagnosis not present

## 2016-08-15 DIAGNOSIS — Z9889 Other specified postprocedural states: Secondary | ICD-10-CM

## 2016-08-15 MED ORDER — OXYCODONE-ACETAMINOPHEN 10-325 MG PO TABS: ORAL_TABLET | ORAL | 0 refills | Status: DC

## 2016-08-15 NOTE — Progress Notes (Signed)
She presents today for her first postop visit she is status post [place] bunionectomy left states that is doing pretty good she denies fever chills nausea vomiting muscle aches pains Pain shortness of breath or chest pain.  Objective: Vital signs are stable alert and oriented 3. Pulses are palpable. Dry sterile dressing intact was removed does not demonstrate a lot of blood. Does demonstrate moderate edema with some ecchymosis to the toes no erythema cellulitis drainage or odor toe appears to be in rectus position. She does have moderate edema. Radiographs do confirm well placed osteotomy with screw fixation.  Assessment: Well-healing surgical foot right status post 1 week.  Plan: Redressed today dressed or compressive dressing encouraged range of motion exercises she is to only utilize her Cam Walker when walking. Follow up with her in 1 week.

## 2016-08-20 NOTE — Progress Notes (Signed)
DOS 12.21.2017 [place] Bunion Repair Left Foot with Screw

## 2016-08-22 ENCOUNTER — Ambulatory Visit (INDEPENDENT_AMBULATORY_CARE_PROVIDER_SITE_OTHER): Payer: 59 | Admitting: Podiatry

## 2016-08-22 DIAGNOSIS — M2012 Hallux valgus (acquired), left foot: Secondary | ICD-10-CM

## 2016-08-22 DIAGNOSIS — M2042 Other hammer toe(s) (acquired), left foot: Secondary | ICD-10-CM | POA: Diagnosis not present

## 2016-08-22 NOTE — Progress Notes (Signed)
She presents today 2 weeks status post [place] bunion repair right foot states that is doing very well with minimal problem. She denies chest pain shortness of breath calf pain.  Objective: Pulses are palpable no calf pain. Vital signs are stable she is alert and oriented 3. Pulses are palpable area surgical site right appears to be healing very nicely sutures were removed today margins remain well coapted she has good range of motion active and passive of the first metatarsophalangeal joint right foot.  Assessment: Well healing surgical foot right foot.  Plan: The compression anklet and a Darco shoe will allow her to wash this daily I encouraged range of motion exercises as well. Follow-up with me in 2 weeks

## 2016-09-05 ENCOUNTER — Ambulatory Visit: Payer: 59

## 2016-09-10 ENCOUNTER — Ambulatory Visit (INDEPENDENT_AMBULATORY_CARE_PROVIDER_SITE_OTHER): Payer: Self-pay | Admitting: Podiatry

## 2016-09-10 ENCOUNTER — Ambulatory Visit (INDEPENDENT_AMBULATORY_CARE_PROVIDER_SITE_OTHER): Payer: 59

## 2016-09-10 DIAGNOSIS — M2042 Other hammer toe(s) (acquired), left foot: Secondary | ICD-10-CM

## 2016-09-10 DIAGNOSIS — M2012 Hallux valgus (acquired), left foot: Secondary | ICD-10-CM

## 2016-09-10 DIAGNOSIS — Z9889 Other specified postprocedural states: Secondary | ICD-10-CM

## 2016-09-11 NOTE — Progress Notes (Signed)
She presents today for follow-up of her Samantha Lloyd bunionectomy left foot. She states it is still having some pain but is doing very well today and surgery was 08/08/2016.  Objective: Vital signs are stable she is alert and oriented 3. There is mild edema no erythema cellulitis drainage or odor incision site has gone on to heal quite nicely. Radiographs demonstrate well-healing osteotomy. Internal fixation is intact. She has slight breakdown of the proximal portion of the incision more than likely secondary to over utilization of essential oils to the area. She states that it was closed but sort of became raw recently.  Assessment: Well-healing osteotomy superficial wound proximally.  Plan: I encouraged her to soak in Epsom salts and warm water and apply a light bandage daily or every other day. I suggest she discontinue the bio oil until it is healed completely. I will allow her to get back and do her regular tennis shoes I will follow up with her in 2-4 weeks.

## 2016-10-03 ENCOUNTER — Ambulatory Visit (INDEPENDENT_AMBULATORY_CARE_PROVIDER_SITE_OTHER): Payer: 59

## 2016-10-03 ENCOUNTER — Encounter: Payer: Self-pay | Admitting: Podiatry

## 2016-10-03 ENCOUNTER — Ambulatory Visit (INDEPENDENT_AMBULATORY_CARE_PROVIDER_SITE_OTHER): Payer: Self-pay | Admitting: Podiatry

## 2016-10-03 DIAGNOSIS — M2042 Other hammer toe(s) (acquired), left foot: Secondary | ICD-10-CM

## 2016-10-03 DIAGNOSIS — M2012 Hallux valgus (acquired), left foot: Secondary | ICD-10-CM

## 2016-10-03 NOTE — Progress Notes (Signed)
She presents today status post [place] bunionectomy right foot. She states that it is getting better daily surgery 08/08/2016.  Objective: Vital signs are stable she is alert and oriented 3. Pulses are palpable. Presents today ambulating to the lateral aspect of the foot. She has good range of motion of the first metatarsophalangeal joint right foot is slightly limited on plantar flexion. Radiographs taken today demonstrate well-healing osteotomy with screw fixation.  Assessment: Well-healing surgical foot right 2 months nearly.  Plan: Encouraged range of motion exercises and normal heel-to-toe gait. Follow up with her in 1 month for her final postop visit.

## 2016-10-22 ENCOUNTER — Encounter: Payer: Self-pay | Admitting: Internal Medicine

## 2016-10-22 ENCOUNTER — Ambulatory Visit (INDEPENDENT_AMBULATORY_CARE_PROVIDER_SITE_OTHER): Payer: Managed Care, Other (non HMO) | Admitting: Internal Medicine

## 2016-10-22 VITALS — BP 102/56 | HR 80 | Temp 98.5°F | Ht 64.0 in | Wt 149.8 lb

## 2016-10-22 DIAGNOSIS — R5383 Other fatigue: Secondary | ICD-10-CM | POA: Diagnosis not present

## 2016-10-22 DIAGNOSIS — Z23 Encounter for immunization: Secondary | ICD-10-CM | POA: Diagnosis not present

## 2016-10-22 DIAGNOSIS — J302 Other seasonal allergic rhinitis: Secondary | ICD-10-CM | POA: Diagnosis not present

## 2016-10-22 NOTE — Assessment & Plan Note (Signed)
Patient with some concern for viral versus allergic rhinitis versus anxiety associated with her needing to make a decision about filing divorce papers. Provided patient with reassurance and Indicated patient should get Zyrtec and Flonase to help with possible seasonal allergies

## 2016-10-22 NOTE — Patient Instructions (Signed)
You likely have seasonal allergies versus a possible mild viral respiratory infection. You can try Zyrtec and Flonase over-the-counter. Follow up as needed and try to get some rest.

## 2016-10-22 NOTE — Progress Notes (Signed)
   [facility] Kerrin Mo, MD Phone: [phone]  Reason For Visit: SDA for not feeling well   # She states she is not feeling well. He has been tired since Friday. Noted a little bit of congestion and red eyes in the morning. Otherwise no other symptoms. Has been eating well and exercising daily. No fevers, no chills, no nausea, no vomiting, no diarrhea, no constipation, no cough, no chest pain. She states she has not been sleeping as well as she normally does for the past couple of days. Later in the visit she reveals to me that she is getting ready to divorce her husband and this has been weighing on her mind. She states that she thinks that this is the reason why she has been fatigued  Past Medical History Reviewed problem list.  Medications- reviewed and updated No additions to family history Social history- patient is a non smoker  Objective: BP (!) 102/56   Pulse 80   Temp 98.5 F (36.9 C) (Oral)   Ht 5\' 4"  (1.626 m)   Wt 149 lb 12.8 oz (67.9 kg)   LMP  (LMP Unknown)   SpO2 91%   BMI 25.71 kg/m  Gen: NAD, alert, cooperative with exam HEENT: Normal    Neck: No masses palpated. No lymphadenopathy, no thyromegaly     Ears: Tympanic membranes intact, normal light reflex, no erythema, no bulging    Nose: erythematous turbinates     Throat: moist mucus membranes, no erythema Cardio: regular rate and rhythm, S1S2 heard, no murmurs appreciated Pulm: clear to auscultation bilaterally, no wheezes, rhonchi or rales GI: soft, non-tender, non-distended, bowel sounds present, no hepatomegaly, no splenomegaly Extremities: warm, well perfused, No edema MSK: Normal gait and station Skin: dry, intact, no rashes or lesions  Assessment/Plan: See problem based a/p  Fatigue Patient with some concern for viral versus allergic rhinitis versus anxiety associated with her needing to make a decision about filing divorce papers. Provided patient with reassurance  and Indicated patient should get Zyrtec and Flonase to help with possible seasonal allergies

## 2016-10-25 ENCOUNTER — Encounter: Payer: Self-pay | Admitting: Family Medicine

## 2016-10-25 ENCOUNTER — Ambulatory Visit (INDEPENDENT_AMBULATORY_CARE_PROVIDER_SITE_OTHER): Payer: Managed Care, Other (non HMO) | Admitting: Family Medicine

## 2016-10-25 VITALS — BP 110/80 | HR 78 | Temp 97.9°F | Ht 64.0 in | Wt 148.8 lb

## 2016-10-25 DIAGNOSIS — H109 Unspecified conjunctivitis: Secondary | ICD-10-CM | POA: Diagnosis not present

## 2016-10-25 MED ORDER — POLYMYXIN B-TRIMETHOPRIM 10000-0.1 UNIT/ML-% OP SOLN: 1.0000 [drp] | Freq: Four times a day (QID) | OPHTHALMIC | 0 refills | Status: AC

## 2016-10-25 NOTE — Patient Instructions (Signed)
Bacterial Conjunctivitis Bacterial conjunctivitis is an infection of your conjunctiva. This is the clear membrane that covers the white part of your eye and the inner surface of your eyelid. This condition can make your eye:  Red or pink.  Itchy. This condition is caused by bacteria. This condition spreads very easily from person to person (is contagious) and from one eye to the other eye. Follow these instructions at home: Medicines   Take or apply your antibiotic medicine as told by your doctor. Do not stop taking or applying the antibiotic even if you start to feel better.  Take or apply over-the-counter and prescription medicines only as told by your doctor.  Do not touch your eyelid with the eye drop bottle or the ointment tube. Managing discomfort   Wipe any fluid from your eye with a warm, wet washcloth or a cotton ball.  Place a cool, clean washcloth on your eye. Do this for 10-20 minutes, 3-4 times per day. General instructions   Do not wear contact lenses until the irritation is gone. Wear glasses until your doctor says it is okay to wear contacts.  Do not wear eye makeup until your symptoms are gone. Throw away any old makeup.  Change or wash your pillowcase every day.  Do not share towels or washcloths with anyone.  Wash your hands often with soap and water. Use paper towels to dry your hands.  Do not touch or rub your eyes.  Do not drive or use heavy machinery if your vision is blurry. Contact a doctor if:  You have a fever.  Your symptoms do not get better after 10 days. Get help right away if:  You have a fever and your symptoms suddenly get worse.  You have very bad pain when you move your eye.  Your face:  Hurts.  Is red.  Is swollen.  You have sudden loss of vision. This information is not intended to replace advice given to you by your health care provider. Make sure you discuss any questions you have with your health care provider. Document  Released: 05/14/2008 Document Revised: 01/11/2016 Document Reviewed: 05/18/2015 Elsevier Interactive Patient Education  2017 [employer].

## 2016-10-25 NOTE — Progress Notes (Signed)
   SUBJECTIVE:  51 y.o. female with burning, redness, discharge and mattering in left eye for 7 days.  No other symptoms.  No significant prior ophthalmological history. No change in visual acuity, no photophobia, no severe eye pain. She notes that she came in on Tuesday and was told that she had a mild URI.  Patient notes that her coworker has pink eye and she works very closely with them as they work a couple feet away from one another  OBJECTIVE:  Patient appears well, vitals signs are normal.  Eyes: left eye with slight conjunctiva erythema and discharge, right eye normal. PERRLA, no foreign body noted. No periorbital cellulitis.   ASSESSMENT:  Conjunctivitis - probably bacterial although could be viral or allergic  PLAN:  Antibiotic drops per order. Hygiene discussed. If other family members develop same condition, may use same medication for them if they are not known to be allergic to it. Call prn.  Smitty Cords, MD [place], PGY-2

## 2016-10-30 ENCOUNTER — Ambulatory Visit: Payer: 59 | Admitting: Family Medicine

## 2016-10-31 ENCOUNTER — Encounter: Payer: Self-pay | Admitting: Podiatry

## 2016-10-31 ENCOUNTER — Ambulatory Visit (INDEPENDENT_AMBULATORY_CARE_PROVIDER_SITE_OTHER): Payer: Managed Care, Other (non HMO)

## 2016-10-31 ENCOUNTER — Ambulatory Visit (INDEPENDENT_AMBULATORY_CARE_PROVIDER_SITE_OTHER): Payer: Self-pay | Admitting: Podiatry

## 2016-10-31 DIAGNOSIS — M2011 Hallux valgus (acquired), right foot: Secondary | ICD-10-CM | POA: Diagnosis not present

## 2016-10-31 NOTE — Progress Notes (Signed)
She presents today for follow-up visit date of surgery 08/08/2016 status post [facility] bunionectomy right foot. She states that is doing very well.  Objective: Vital signs are stable she is alert and oriented 3 no erythema edema cellulitis drainage or odor. She has good range of motion on dorsiflexion plantar flexion is limited to neutral. Radiographs demonstrate 3 views osseously mature individual right foot well-healing osteotomy screw fixation intact.  Assessment: Well-healing surgical foot with limited plantar flexion first metatarsophalangeal joint.  Plan: We'll allow the patient to get back to all activities however I did recommend physical therapy demonstrated to her how to plantarflex the toe she understands this and is amenable to it does not want to go to physical therapy at this point she will try it on her own. Follow up with her in a couple of months.

## 2016-11-12 ENCOUNTER — Encounter: Payer: Self-pay | Admitting: Obstetrics and Gynecology

## 2016-11-12 ENCOUNTER — Ambulatory Visit (INDEPENDENT_AMBULATORY_CARE_PROVIDER_SITE_OTHER): Payer: Managed Care, Other (non HMO) | Admitting: Obstetrics and Gynecology

## 2016-11-12 VITALS — BP 110/64 | HR 75 | Temp 98.0°F | Wt 144.0 lb

## 2016-11-12 DIAGNOSIS — H04123 Dry eye syndrome of bilateral lacrimal glands: Secondary | ICD-10-CM | POA: Diagnosis not present

## 2016-11-12 MED ORDER — CARBOXYMETHYLCELLUL-GLYCERIN 0.5-0.9 % OP SOLN: OPHTHALMIC | 0 refills | Status: DC

## 2016-11-12 NOTE — Patient Instructions (Signed)
Viral Conjunctivitis, Adult Viral conjunctivitis is an inflammation of the clear membrane that covers the white part of your eye and the inner surface of your eyelid (conjunctiva). The inflammation is caused by a viral infection. The blood vessels in the conjunctiva become inflamed, causing the eye to become red or pink, and often itchy. Viral conjunctivitis can be easily passed from one person to another (is contagious). This condition is often called pink eye. What are the causes? This condition is caused by a virus. A virus is a type of contagious germ. It can be spread by touching objects that have been contaminated with the virus, such as doorknobs or towels. It can also be passed through droplets, such as from coughing or sneezing. What are the signs or symptoms? Symptoms of this condition include:  Eye redness.  Tearing or watery eyes.  Itchy and irritated eyes.  Burning feeling in the eyes.  Clear drainage from the eye.  Swollen eyelids.  A gritty feeling in the eye.  Light sensitivity. This condition often occurs with other symptoms, such as a fever, nausea, or a rash. How is this diagnosed? This condition is diagnosed with a medical history and physical exam. If you have discharge from your eye, the discharge may be tested to rule out other causes of conjunctivitis. How is this treated? Viral conjunctivitis does not respond to medicines that kill bacteria (antibiotics). Treatment for viral conjunctivitis is directed at stopping a bacterial infection from developing in addition to the viral infection. Treatment also aims to relieve your symptoms, such as itching. This may be done with antihistamine drops or other eye medicines. Rarely, steroid eye drops or antiviral medicines may be prescribed. Follow these instructions at home: Medicines    Take or apply over-the-counter and prescription medicines only as told by your health care provider.  Be very careful to avoid touching  the edge of the eyelid with the eye drop bottle or ointment tube when applying medicines to the affected eye. Being careful this way will stop you from spreading the infection to the other eye or to other people. Eye care   Avoid touching or rubbing your eyes.  Apply a warm, wet, clean washcloth to your eye for 10-20 minutes, 3-4 times per day or as told by your health care provider.  If you wear contact lenses, do not wear them until the inflammation is gone and your health care provider says it is safe to wear them again. Ask your health care provider how to sterilize or replace your contact lenses before using them again. Wear glasses until you can resume wearing contacts.  Avoid wearing eye makeup until the inflammation is gone. Throw away any old eye cosmetics that may be contaminated.  Gently wipe away any drainage from your eye with a warm, wet washcloth or a cotton ball. General instructions   Change or wash your pillowcase every day or as told by your health care provider.  Do not share towels, pillowcases, washcloths, eye makeup, makeup brushes, contact lenses, or glasses. This may spread the infection.  Wash your hands often with soap and water. Use paper towels to dry your hands. If soap and water are not available, use hand sanitizer.  Try to avoid contact with other people for one week or as told by your health care provider. Contact a health care provider if:  Your symptoms do not improve with treatment or they get worse.  You have increased pain.  Your vision becomes blurry.  You   have a fever.  You have facial pain, redness, or swelling.  You have yellow or green drainage coming from your eye.  You have new symptoms. This information is not intended to replace advice given to you by your health care provider. Make sure you discuss any questions you have with your health care provider. Document Released: 10/26/2002 Document Revised: 03/02/2016 Document Reviewed:  02/20/2016 Elsevier Interactive Patient Education  2017 Elsevier Inc.  

## 2016-11-12 NOTE — Progress Notes (Signed)
   Subjective:   Patient ID: Samantha Lloyd, female    DOB: [birth year], 51 y.o.   MRN: [id number]  Patient presents for Same Day Appointment  Chief Complaint  Patient presents with  . Dry Eye    HPI: # Dry Eye: Last Sunday symptoms started Having scratchy red eye on the left Feels dry Redness is improving Happened a couple weeks ago and was given antibiotic for pink eye Has exposure to pink eye last time, 3 weeks ago No issues prior Wears contacts but states she has not been wearing them since Sunday Does not sleep in contacts No fevers Gets some clear drainage from eyes  Review of Systems   See HPI for ROS.   History  Smoking Status  . Never Smoker  Smokeless Tobacco  . Never Used    Past medical history, surgical, family, and social history reviewed and updated in the EMR as appropriate.  Pertinent Historical Findings include: No known allergies Objective:  BP 110/64   Pulse 75   Temp 98 F (36.7 C) (Oral)   Wt 144 lb (65.3 kg)   LMP  (LMP Unknown)   SpO2 99%   BMI 24.72 kg/m  Vitals and nursing note reviewed  Physical Exam  Constitutional: She is well-developed, well-nourished, and in no distress.  HENT:  Head: Normocephalic and atraumatic.  Mouth/Throat: Oropharynx is clear and moist.  Eyes: Conjunctivae and EOM are normal. Pupils are equal, round, and reactive to light. Right eye exhibits no discharge. Left eye exhibits no discharge and no exudate. No scleral icterus.    Assessment & Plan:  1. Dry eyes Treated for bacterial conjunctivitis at last visit. Received antibiotics. Completed course of antibiotics. No signs of bacterial conjunctivitis. Exam unremarkable. Most likely has dry eyes. This will account for some mild erythema and watering patient associates. Rx given for lubruicating eyedrops. Patient to return to clinic if symptoms not improved. Given return precautions. Counseled to not wear contacts when eyes are irritated.  Diagnosis and plan  along with any newly prescribed medication(s) were discussed in detail with this patient today. The patient verbalized understanding and agreed with the plan. Patient advised if symptoms worsen return to clinic.   PATIENT EDUCATION PROVIDED: See AVS   Luiz Blare, DO 11/12/2016, 8:43 AM PGY-3, [place]

## 2016-12-31 ENCOUNTER — Ambulatory Visit (INDEPENDENT_AMBULATORY_CARE_PROVIDER_SITE_OTHER): Payer: Managed Care, Other (non HMO) | Admitting: Podiatry

## 2016-12-31 ENCOUNTER — Ambulatory Visit (INDEPENDENT_AMBULATORY_CARE_PROVIDER_SITE_OTHER): Payer: Managed Care, Other (non HMO)

## 2016-12-31 ENCOUNTER — Encounter: Payer: Self-pay | Admitting: Podiatry

## 2016-12-31 DIAGNOSIS — M2011 Hallux valgus (acquired), right foot: Secondary | ICD-10-CM

## 2016-12-31 NOTE — Progress Notes (Signed)
She presents today for her final postop visit date of surgery 08/08/2016 [facility] bunion repair left foot states it still still some but is doing much better.  Objective: Vital signs are stable she's alert and oriented 3 she is greater range of motion with dorsiflexion she has about 10 of plantarflexion but she still has a prominent tibial sesamoid. Radiographs confirm well-healed osteotomy internal fixation is in good position. Her toes sits rectus. I see no signs of infection. Inflammation has decreased considerably minimal edema.  Assessment: Well-healing surgical foot left.  Plan: Follow up with me in the future for surgical repair to the right foot.

## 2017-04-03 ENCOUNTER — Other Ambulatory Visit: Payer: Self-pay | Admitting: Family Medicine

## 2017-04-03 DIAGNOSIS — Z1231 Encounter for screening mammogram for malignant neoplasm of breast: Secondary | ICD-10-CM

## 2017-04-11 ENCOUNTER — Ambulatory Visit
Admission: RE | Admit: 2017-04-11 | Discharge: 2017-04-11 | Disposition: A | Discharge status: Home / Self Care | Payer: 59 | Source: Ambulatory Visit | Attending: Family Medicine | Admitting: Family Medicine

## 2017-04-11 DIAGNOSIS — Z1231 Encounter for screening mammogram for malignant neoplasm of breast: Secondary | ICD-10-CM

## 2017-04-14 ENCOUNTER — Other Ambulatory Visit: Payer: Self-pay | Admitting: Family Medicine

## 2017-04-14 DIAGNOSIS — R928 Other abnormal and inconclusive findings on diagnostic imaging of breast: Secondary | ICD-10-CM

## 2017-04-15 ENCOUNTER — Ambulatory Visit
Admission: RE | Admit: 2017-04-15 | Discharge: 2017-04-15 | Disposition: A | Discharge status: Home / Self Care | Payer: 59 | Source: Ambulatory Visit | Attending: Family Medicine | Admitting: Family Medicine

## 2017-04-15 ENCOUNTER — Ambulatory Visit: Payer: 59 | Admitting: Family Medicine

## 2017-04-15 DIAGNOSIS — R928 Other abnormal and inconclusive findings on diagnostic imaging of breast: Secondary | ICD-10-CM

## 2017-04-17 ENCOUNTER — Telehealth: Payer: Self-pay | Admitting: *Deleted

## 2017-04-17 NOTE — Telephone Encounter (Signed)
"  I wanted to try to schedule my bunion surgery on my right foot with Dr. Milinda Pointer for sometime in November.  I was calling to see if I could schedule my right foot surgery sometime in November.  Give me a call."

## 2017-04-18 NOTE — Telephone Encounter (Signed)
"  I want to schedule my surgery in November.  Is he doing surgery the Friday after Thanksgiving?"  No, he can do it on November 30, the following Friday.  "Can you put me down for then?  I already have a date for a consult scheduled to sign the consent forms."  I'll put you down tentatively until we get your signed consent form.

## 2017-04-23 ENCOUNTER — Ambulatory Visit (INDEPENDENT_AMBULATORY_CARE_PROVIDER_SITE_OTHER): Payer: 59 | Admitting: Internal Medicine

## 2017-04-23 ENCOUNTER — Encounter: Payer: Self-pay | Admitting: Internal Medicine

## 2017-04-23 VITALS — BP 112/80 | HR 62 | Temp 97.6°F | Ht 64.0 in | Wt 141.0 lb

## 2017-04-23 DIAGNOSIS — L918 Other hypertrophic disorders of the skin: Secondary | ICD-10-CM

## 2017-04-23 DIAGNOSIS — Z1211 Encounter for screening for malignant neoplasm of colon: Secondary | ICD-10-CM

## 2017-04-23 NOTE — Progress Notes (Signed)
[facility] Progress Note  Subjective:  Samantha Lloyd is a 51 y.o. female with previous history of obesity who presents for concern about moles on neck and need for colonoscopy referral.  #Skintags: - Has had multiple removed before (11 removed 12/2015, including 4 on neck) - Notes few on her neck are not really bothering her or hurting at this point - Wonders if they would be big enough to remove - Has not noticed any recent change in size ROS: No rash  #Need for colonoscopy: - Denies family history of colon cancer - Denies blood in stool or dark stools - Has BMs about every other day and sometimes strains - Would like to be seen at [facility]: Never smoker; works out at [employer] (weights, elliptical) most days  No Known Allergies  Objective: Blood pressure 112/80, pulse 62, temperature 97.6 F (36.4 C), temperature source Oral, height 5\' 4"  (1.626 m), weight 141 lb (64 kg). Body mass index is 24.2 kg/m. Constitutional: Well appearing female in NAD HENT: MMM, no nasal congestion Cardiovascular: RRR, S1, S2, no m/r/g.  Pulmonary/Chest: Effort normal and breath sounds normal. No respiratory distress.  Abdominal: Soft. +BS, NT, ND Musculoskeletal: No LE edema Neurological: AOx3, no focal deficits. Skin: 3 small skin tags on anterior neck. All < 1 mm.  Psychiatric: Normal mood and affect.  Vitals reviewed  Assessment/Plan: [place] - Offered patient option of removing largest skin tags. She declined as she says she does not notice much and does not think would be worth undergoing a procedure at this time.  - To continuing monitoring  Colon cancer screening - Over age 75. Negative family history. - Placed order for colonoscopy, as patient would like to be seen at [facility] if they accept her insurance.  - Recommended miralax for constipation.   Follow-up prn.  Olene Floss, MD [place], PGY-3

## 2017-04-23 NOTE — Patient Instructions (Addendum)
Ms. Lovejoy,  Dennis Bast are doing great with your fitness journey! Increase water intake. You may want to try miralax to have a regular bowel movement daily.   I will place the referral for your colonoscopy. You will get a call from [facility] about making an appointment.   If the skin tags bother you more, we are happy to remove them.  Please see [place] back as needed.  Best, Dr. Ola Spurr

## 2017-04-25 DIAGNOSIS — Z1211 Encounter for screening for malignant neoplasm of colon: Secondary | ICD-10-CM | POA: Insufficient documentation

## 2017-04-25 NOTE — Assessment & Plan Note (Signed)
-   Offered patient option of removing largest skin tags. She declined as she says she does not notice much and does not think would be worth undergoing a procedure at this time.  - To continuing monitoring

## 2017-04-25 NOTE — Assessment & Plan Note (Signed)
-   Over age 51. Negative family history. - Placed order for colonoscopy, as patient would like to be seen at [facility] if they accept her insurance.  - Recommended miralax for constipation.

## 2017-05-06 ENCOUNTER — Ambulatory Visit (INDEPENDENT_AMBULATORY_CARE_PROVIDER_SITE_OTHER): Payer: Managed Care, Other (non HMO)

## 2017-05-06 ENCOUNTER — Ambulatory Visit (INDEPENDENT_AMBULATORY_CARE_PROVIDER_SITE_OTHER): Payer: Managed Care, Other (non HMO) | Admitting: Podiatry

## 2017-05-06 ENCOUNTER — Encounter: Payer: Self-pay | Admitting: Podiatry

## 2017-05-06 DIAGNOSIS — M2010 Hallux valgus (acquired), unspecified foot: Secondary | ICD-10-CM | POA: Diagnosis not present

## 2017-05-06 NOTE — Progress Notes (Signed)
She presents today with chief complaint of a painful first metatarsophalangeal joint of the right foot. States the second toe seems to be overlapping the third toe to some degree. She states it is starting to affect her ability to perform her daily activities and wear shoe gear. She's tried losing weight and would like to continue to exercise however this is inhibiting her from doing so.  Objective: Vital signs are stable she is alert and oriented 3. Pulses are palpable. Neurologic sensorium is intact. Deep tendon reflexes are intact. Muscle strength is 5 over 5 dorsiflexion plantar flexors and inverters everters onto the musculatures intact. 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 Mild elevated second toe on lateral view the radiographs. Nontender on palpation and range of motion.  Assessment: Hallux valgus right hammertoe contracted second metatarsophalangeal joint right foot.  Plan: We discussed etiology and pathology conservative versus surgical therapies. At this point she would like to consider surgical intervention to repair the first metatarsophalangeal joint of the right foot. I answered all their questions regarding and [place] bunion repair with screw fixation right foot to the best of my ability in layman's terms we will also perform a release at the level of the second toe. She already has a Dispensing optician at home from her previous surgery she also R has me if her questions answered. I will follow up with her in the near future for surgical intervention.

## 2017-05-07 ENCOUNTER — Telehealth: Payer: Self-pay | Admitting: *Deleted

## 2017-05-07 NOTE — Telephone Encounter (Signed)
"  I want to move my surgery up to October 26."  I'll get it rescheduled from 07/18/2017 to 06/13/2017.  "Someone from the surgical center will give me a call correct?"  Yes, they normally call a day or two prior to surgery date with the arrival time.

## 2017-06-11 ENCOUNTER — Other Ambulatory Visit: Payer: Self-pay | Admitting: Podiatry

## 2017-06-11 MED ORDER — CEPHALEXIN 500 MG PO CAPS
500.0000 mg | ORAL_CAPSULE | Freq: Three times a day (TID) | ORAL | 0 refills | Status: DC
Start: 2017-06-11 — End: 2017-07-22

## 2017-06-11 MED ORDER — OXYCODONE-ACETAMINOPHEN 10-325 MG PO TABS: 1.0000 | ORAL_TABLET | ORAL | 0 refills | Status: DC | PRN

## 2017-06-11 MED ORDER — PROMETHAZINE HCL 25 MG PO TABS: 25.0000 mg | ORAL_TABLET | Freq: Three times a day (TID) | ORAL | 0 refills | Status: DC | PRN

## 2017-06-13 ENCOUNTER — Encounter: Payer: Self-pay | Admitting: Podiatry

## 2017-06-13 ENCOUNTER — Telehealth: Payer: Self-pay | Admitting: Podiatry

## 2017-06-13 DIAGNOSIS — M2041 Other hammer toe(s) (acquired), right foot: Secondary | ICD-10-CM | POA: Diagnosis not present

## 2017-06-13 DIAGNOSIS — M2011 Hallux valgus (acquired), right foot: Secondary | ICD-10-CM | POA: Diagnosis not present

## 2017-06-13 NOTE — Telephone Encounter (Signed)
I had an [place] surgery done this morning by Dr. Milinda Pointer. I need a note for work saying I need to work from home until [employer] able to drive. If you need to speak with me, you can call me at [phone]. Thank you.

## 2017-06-16 ENCOUNTER — Encounter: Payer: Self-pay | Admitting: *Deleted

## 2017-06-16 NOTE — Telephone Encounter (Signed)
I informed pt the letter had been typed and ready to be sent. Pt requested it be emailed to - [email or web address]. Bethann Humble - Records Coordinator emailed.

## 2017-06-19 ENCOUNTER — Ambulatory Visit (INDEPENDENT_AMBULATORY_CARE_PROVIDER_SITE_OTHER): Payer: Managed Care, Other (non HMO)

## 2017-06-19 ENCOUNTER — Encounter: Payer: Self-pay | Admitting: Podiatry

## 2017-06-19 ENCOUNTER — Ambulatory Visit (INDEPENDENT_AMBULATORY_CARE_PROVIDER_SITE_OTHER): Payer: Managed Care, Other (non HMO) | Admitting: Podiatry

## 2017-06-19 VITALS — BP 110/75 | HR 68 | Temp 97.4°F

## 2017-06-19 DIAGNOSIS — M2011 Hallux valgus (acquired), right foot: Secondary | ICD-10-CM

## 2017-06-19 DIAGNOSIS — M2010 Hallux valgus (acquired), unspecified foot: Secondary | ICD-10-CM

## 2017-06-19 DIAGNOSIS — M2042 Other hammer toe(s) (acquired), left foot: Secondary | ICD-10-CM

## 2017-06-19 DIAGNOSIS — M2012 Hallux valgus (acquired), left foot: Secondary | ICD-10-CM

## 2017-06-19 MED ORDER — OXYCODONE-ACETAMINOPHEN 10-325 MG PO TABS: 1.0000 | ORAL_TABLET | ORAL | 0 refills | Status: DC | PRN

## 2017-06-19 NOTE — Progress Notes (Signed)
DOS 10.26.18 [place] Bunion repair Rt foot w screw, single tenotomy 2nd toe Rt foot

## 2017-06-21 NOTE — Progress Notes (Signed)
She presents today date of surgery 06/13/2017 status post [facility] bunion repair and tenotomy second digit right foot. States that this is doing great and have no problems whatsoever.  Objective: Vital signs are stable she is alert and oriented 3. Pulses are palpable. Restoril dressing was removed demonstrates no erythema mild edema no cellulitis drainage or odor incision site appears to be healing very nicely. She had a great range of motion. Radiographs confirm I osteotomy good position.  Assessment: Well-healing surgical foot.  Plan: [place] today dressed with compressive dressing follow-up with me in 1 week.

## 2017-06-26 ENCOUNTER — Ambulatory Visit (INDEPENDENT_AMBULATORY_CARE_PROVIDER_SITE_OTHER): Payer: Managed Care, Other (non HMO) | Admitting: Podiatry

## 2017-06-26 DIAGNOSIS — M2011 Hallux valgus (acquired), right foot: Secondary | ICD-10-CM

## 2017-06-26 NOTE — Progress Notes (Signed)
She presents today for her second postop visit states that she is doing very well with minimal pain.  Objective: Vital signs are stable she is alert and oriented 3. There is no erythema or edema cellulitis drainage or odor. She has great range of motion of the first metatarsophalangeal joint with very little pain.  Assessment: Well-healing surgical foot right.  Plan: Discontinue the cam walker place her in a compression anklet in a Darco shoe. I will follow-up with her in 2 weeks for use of irregular tissue and x-rays to be taken at that time.

## 2017-07-04 ENCOUNTER — Ambulatory Visit: Payer: 59

## 2017-07-07 ENCOUNTER — Telehealth: Payer: Self-pay | Admitting: Internal Medicine

## 2017-07-07 NOTE — Telephone Encounter (Signed)
Pt informed. Keegan Ducey, CMA  

## 2017-07-07 NOTE — Telephone Encounter (Signed)
Please let patient know adults are less likely to catch hand foot and mouth but that it is possible. If she were to get it, it would likely be mild. It is spread by secretions (saliva), droplets (coughing), and in stool. I recommend washing hands very well. Could consider wearing a mask but likely already exposed. Thank you.

## 2017-07-07 NOTE — Telephone Encounter (Signed)
Samantha Lloyd has been exposed to hand foot mouth.  He has been running a fever and has red bumps on him.  He hasnt been diagnosed yet and he will be taken to dr today.  She wants to know if she can get it and what she can do to not get it herself.  Please advise

## 2017-07-15 ENCOUNTER — Ambulatory Visit (INDEPENDENT_AMBULATORY_CARE_PROVIDER_SITE_OTHER): Payer: Managed Care, Other (non HMO) | Admitting: Podiatry

## 2017-07-15 ENCOUNTER — Encounter: Payer: Self-pay | Admitting: Podiatry

## 2017-07-15 ENCOUNTER — Ambulatory Visit (INDEPENDENT_AMBULATORY_CARE_PROVIDER_SITE_OTHER): Payer: Managed Care, Other (non HMO)

## 2017-07-15 DIAGNOSIS — M2011 Hallux valgus (acquired), right foot: Secondary | ICD-10-CM | POA: Diagnosis not present

## 2017-07-15 DIAGNOSIS — M2010 Hallux valgus (acquired), unspecified foot: Secondary | ICD-10-CM | POA: Diagnosis not present

## 2017-07-15 NOTE — Progress Notes (Signed)
She presents today for a 1 month postop visit regarding a [place] bunion repair to the right foot. She states that she is doing very well she has very little pain associated with the foot. She states that she will let wish her left foot would have healed as well as her right foot.  Objective: Vital signs are stable she's alert and oriented 3 incision sites, heal uneventfully. She has minimal edema great range of motion of the first metatarsophalangeal joint dorsiflexion and plantarflexion. He regressed taken today demonstrate well-healing osteotomy with screw fixation.  Assessment: Well-healing surgical foot right.  Plan: Going to encourage her to try to get into some regular shoe gear and I will follow-up with her in 1 month. X-rays will be performed at that time.

## 2017-07-22 ENCOUNTER — Encounter: Payer: Self-pay | Admitting: Family Medicine

## 2017-07-22 ENCOUNTER — Ambulatory Visit (INDEPENDENT_AMBULATORY_CARE_PROVIDER_SITE_OTHER): Payer: 59 | Admitting: Family Medicine

## 2017-07-22 VITALS — BP 110/70 | HR 87 | Temp 97.8°F | Wt 136.4 lb

## 2017-07-22 DIAGNOSIS — A084 Viral intestinal infection, unspecified: Secondary | ICD-10-CM | POA: Diagnosis not present

## 2017-07-22 DIAGNOSIS — Z23 Encounter for immunization: Secondary | ICD-10-CM

## 2017-07-22 NOTE — Assessment & Plan Note (Addendum)
Acute.  Likely viral given recent exposure to hand-foot-and-mouth.  No associated vomiting taking food poisoning unlikely.  History not consistent with bacterial or parasitic etiology.  Reassuring given patient's ability to maintain oral intake.  No red flags.  Up-to-date on colonoscopy. - Recommending conservative management with fluid challenge, Metamucil, and loperamide if needed - Administered flu shot - RTC 2 weeks for reassessment of diarrhea

## 2017-07-22 NOTE — Progress Notes (Signed)
   Subjective   Patient ID: Samantha Lloyd    DOB: 1965/12/30, 51 y.o. female   MRN: [id number]  CC: "Diarrhea since Sunday"  HPI: Samantha Lloyd is a 51 y.o. female who presents for a same day appointment for the following:  DIARRHEA  Having diarrhea for 2 days Progression: Sudden onset, seems to be intermitent Stools per day: 5 Does diarrhea wake patient: Yes Medications tried: No Recent travel: No Sick contacts: Yes (son dx with hand, foot, and mouth) Ingested suspicious foods: No Antibiotics recently: No Immunocompromised: No  Symptoms Vomiting: No Abdominal pain: No Weight Loss: No Decreased urine output: No Lightheadedness: No Fever: No Bloody stools: No  Of note, recently had colonoscopy with normal finding.  ROS: see HPI for pertinent.  [place]: Fibroids, hot flashes.  Surgical history unremarkable.  Family history unremarkable.  Smoking status reviewed. Medications reviewed.  Objective   BP 110/70   Pulse 87   Temp 97.8 F (36.6 C) (Oral)   Wt 136 lb 6.4 oz (61.9 kg)   LMP  (LMP Unknown)   SpO2 99%   BMI 23.41 kg/m  Vitals and nursing note reviewed.  General: well nourished, well developed, NAD with non-toxic appearance HEENT: normocephalic, atraumatic, moist mucous membranes Cardiovascular: regular rate and rhythm without murmurs, rubs, or gallops Lungs: clear to auscultation bilaterally with normal work of breathing Abdomen: soft, non-tender, non-distended, normoactive bowel sounds Skin: warm, dry, no rashes or lesions, cap refill < 2 seconds Extremities: warm and well perfused, normal tone, no edema  Assessment & Plan   Viral diarrhea Acute.  Likely viral given recent exposure to hand-foot-and-mouth.  Does not no associated vomiting.  Reassuring given patient's ability to maintain oral intake.  No red flags.  Up-to-date on colonoscopy. - Recommending conservative management with fluid challenge, Metamucil, and loperamide if needed -  Administered flu shot - RTC 2 weeks for reassessment of diarrhea  Orders Placed This Encounter  Procedures  . Flu Vaccine QUAD 36+ mos IM   No orders of the defined types were placed in this encounter.   Harriet Butte, [place], PGY-2 07/22/2017, 9:37 AM

## 2017-07-22 NOTE — Patient Instructions (Signed)
Thank you for coming in to see [place] today. Please see below to review our plan for today's visit.  1.  Your diarrhea is likely from a viral illness.  I would anticipate this to improve over the next week.  It is important that you continue to take in plenty of fluids to make up for your losses.  You can supplement with Metamucil over-the-counter increase fiber intake.  If diarrhea is becoming a significant issue throughout the day, you can get over-the-counter loperamide to help slow down gut motility.  Be sure to wash your hands thoroughly wherever you go along with other family members. 2.  You are now up-to-date on your annual flu shot.  Please encourage others to get there flu shots to. 3.  I would like to see you in 2 weeks to make sure your diarrhea has resolved.  Please call the clinic at [phone] if your symptoms worsen or you have any concerns. It was our pleasure to serve you.  Harriet Butte, [place], PGY-2

## 2017-08-14 ENCOUNTER — Encounter: Payer: Self-pay | Admitting: Podiatry

## 2017-08-14 ENCOUNTER — Ambulatory Visit (INDEPENDENT_AMBULATORY_CARE_PROVIDER_SITE_OTHER): Payer: Managed Care, Other (non HMO)

## 2017-08-14 ENCOUNTER — Ambulatory Visit (INDEPENDENT_AMBULATORY_CARE_PROVIDER_SITE_OTHER): Payer: Managed Care, Other (non HMO) | Admitting: Podiatry

## 2017-08-14 ENCOUNTER — Ambulatory Visit: Payer: Managed Care, Other (non HMO) | Admitting: Podiatry

## 2017-08-14 DIAGNOSIS — M2041 Other hammer toe(s) (acquired), right foot: Secondary | ICD-10-CM

## 2017-08-14 DIAGNOSIS — M2011 Hallux valgus (acquired), right foot: Secondary | ICD-10-CM | POA: Diagnosis not present

## 2017-08-14 NOTE — Progress Notes (Signed)
She presents today 2 months status post bunion repair right foot.  States that she is doing very well.  She presents today in her Darco shoe.  Objective: His great range of motion and minimal edema to the right lower extremity at the level of the first metatarsophalangeal joint.  Minimal restriction in range of motion.  No pain on palpation.  A new cast taken today demonstrate a well healing osteotomy with screw fixation well retained.  Assessment: Well-healing surgical foot right.  Plan: We will allow her to get back into regular shoe gear follow-up with me in 1 month to be released for work.

## 2017-09-11 ENCOUNTER — Ambulatory Visit (INDEPENDENT_AMBULATORY_CARE_PROVIDER_SITE_OTHER): Payer: Managed Care, Other (non HMO) | Admitting: Podiatry

## 2017-09-11 ENCOUNTER — Encounter: Payer: Managed Care, Other (non HMO) | Admitting: Podiatry

## 2017-09-11 ENCOUNTER — Encounter: Payer: Self-pay | Admitting: Podiatry

## 2017-09-11 ENCOUNTER — Ambulatory Visit (INDEPENDENT_AMBULATORY_CARE_PROVIDER_SITE_OTHER): Payer: Managed Care, Other (non HMO)

## 2017-09-11 DIAGNOSIS — M2041 Other hammer toe(s) (acquired), right foot: Secondary | ICD-10-CM

## 2017-09-11 DIAGNOSIS — M2011 Hallux valgus (acquired), right foot: Secondary | ICD-10-CM

## 2017-09-11 NOTE — Progress Notes (Signed)
She presents today for a postop visit date of surgery is 06/13/2017 status post [facility] bunion repair right foot.  She states that she is been doing very well with this and has had no problems with it.  States that is starting to feel much better the numbness is still present but appears to be diminishing.  She states that she is able to walk and get around and do pretty much anything that she needs to do at this point.  Objective: Vital signs are stable she is alert and oriented x3.  There is no erythema edema cellulitis drainage or odor incision site is gone on to heal uneventfully has great range of motion dorsiflexion and plantarflexion.  Radiographs taken today demonstrate well healing osteotomy with screw fixation.  Fixation is in good position and intact.  Assessment: Well-healing surgical foot right.  Plan: Follow-up with me on an as-needed basis only.  Allow her to get back to her regular routine and job.

## 2017-10-24 ENCOUNTER — Ambulatory Visit (INDEPENDENT_AMBULATORY_CARE_PROVIDER_SITE_OTHER): Payer: 59 | Admitting: Family Medicine

## 2017-10-24 ENCOUNTER — Encounter: Payer: Self-pay | Admitting: Family Medicine

## 2017-10-24 VITALS — BP 110/80 | HR 82 | Temp 98.6°F | Wt 145.2 lb

## 2017-10-24 DIAGNOSIS — J029 Acute pharyngitis, unspecified: Secondary | ICD-10-CM | POA: Diagnosis not present

## 2017-10-24 DIAGNOSIS — J069 Acute upper respiratory infection, unspecified: Secondary | ICD-10-CM | POA: Insufficient documentation

## 2017-10-24 DIAGNOSIS — J028 Acute pharyngitis due to other specified organisms: Principal | ICD-10-CM

## 2017-10-24 DIAGNOSIS — B9789 Other viral agents as the cause of diseases classified elsewhere: Secondary | ICD-10-CM

## 2017-10-24 LAB — POCT RAPID STREP A (OFFICE): RAPID STREP A SCREEN: NEGATIVE

## 2017-10-24 NOTE — Progress Notes (Signed)
   HPI 52 year old who presents with around one week history of sore throat. States that her throat is mainly sore in the anterior distribution. She has no other symptoms aside from mild cough and congestion. She has been able to exercise regularly and is presenting she is annoyed her sore throat has not resolved yet. She has been able to eat and drink liquids without any difficulty. Tested negative for strep.  CC: sore throat   ROS: Review of Systems  Constitutional: Negative for chills and fever.  HENT: Positive for congestion and sore throat.   Respiratory: Positive for cough.   Cardiovascular: Negative for palpitations.  Gastrointestinal: Negative for abdominal pain, constipation, diarrhea, nausea and vomiting.  Musculoskeletal: Negative for myalgias.    Review of Systems See HPI for ROS.   CC, SH/smoking status, and VS noted  Objective: BP 110/80 (BP Location: Left Arm, Patient Position: Sitting, Cuff Size: Normal)   Pulse 82   Temp 98.6 F (37 C) (Oral)   Wt 145 lb 3.2 oz (65.9 kg)   LMP  (LMP Unknown)   SpO2 97%   BMI 24.92 kg/m  Gen: NAD, alert, cooperative, and pleasant. Well appearing African American Female HEENT: NCAT, EOMI, PERRL, no erythema in posterior pharynx, no cervical lymphadenopathy CV: RRR, no murmur Resp: CTAB, no wheezes, non-labored Abd: SNTND, BS present, no guarding or organomegaly Ext: No edema, warm Neuro: Alert and oriented, Speech clear, No gross deficits   Assessment and plan:  Viral sore throat Likely viral cause of her sore throat. Tested negative for strep. Outside window for tamiflu so no testing indicated. Recommended OTC medications for symptomatic relief such as benzocaine and tylenol. Return precautions given.   Orders Placed This Encounter  Procedures  . POCT rapid strep A    No orders of the defined types were placed in this encounter.   Guadalupe Dawn MD PGY-1 Family Medicine Resident 10/27/2017 8:43 AM

## 2017-10-24 NOTE — Patient Instructions (Signed)
It was great seeing you today! I am sorry that you have been having such a sore throat. I believe it is due to a viral upper respiratory infection that is getting better. There is no need for antibiotics or anything of that sort as these are ineffective against viruses. It should get better on its own in the next 3-4 days. I gave you some education on over the counter treatments you can take for your sore throat. In general aleve and tylenol will help. There is also a medication called Benzocaine which is available in spray form to help. Please come back and see [place] if you get worse or there are any additional issues.

## 2017-10-27 NOTE — Assessment & Plan Note (Signed)
Likely viral cause of her sore throat. Tested negative for strep. Outside window for tamiflu so no testing indicated. Recommended OTC medications for symptomatic relief such as benzocaine and tylenol. Return precautions given.

## 2018-05-21 ENCOUNTER — Other Ambulatory Visit: Payer: Self-pay | Admitting: Family Medicine

## 2018-05-21 DIAGNOSIS — Z1231 Encounter for screening mammogram for malignant neoplasm of breast: Secondary | ICD-10-CM

## 2018-06-29 ENCOUNTER — Ambulatory Visit
Admission: RE | Admit: 2018-06-29 | Discharge: 2018-06-29 | Disposition: A | Discharge status: Home / Self Care | Payer: POS | Source: Ambulatory Visit | Attending: Family Medicine | Admitting: Family Medicine

## 2018-06-29 ENCOUNTER — Ambulatory Visit (INDEPENDENT_AMBULATORY_CARE_PROVIDER_SITE_OTHER): Payer: 59 | Admitting: Family Medicine

## 2018-06-29 ENCOUNTER — Other Ambulatory Visit (HOSPITAL_COMMUNITY)
Admission: RE | Admit: 2018-06-29 | Discharge: 2018-06-29 | Disposition: A | Discharge status: Home / Self Care | Payer: POS | Source: Ambulatory Visit | Attending: Family Medicine | Admitting: Family Medicine

## 2018-06-29 ENCOUNTER — Encounter: Payer: 59 | Admitting: Family Medicine

## 2018-06-29 VITALS — BP 110/80 | HR 76 | Temp 98.0°F | Wt 141.8 lb

## 2018-06-29 DIAGNOSIS — Z0000 Encounter for general adult medical examination without abnormal findings: Secondary | ICD-10-CM | POA: Diagnosis present

## 2018-06-29 DIAGNOSIS — Z7251 High risk heterosexual behavior: Secondary | ICD-10-CM

## 2018-06-29 DIAGNOSIS — Z1231 Encounter for screening mammogram for malignant neoplasm of breast: Secondary | ICD-10-CM

## 2018-06-29 DIAGNOSIS — Z23 Encounter for immunization: Secondary | ICD-10-CM | POA: Diagnosis not present

## 2018-06-29 LAB — POCT WET PREP (WET MOUNT)
CLUE CELLS WET PREP WHIFF POC: NEGATIVE
Trichomonas Wet Prep HPF POC: ABSENT

## 2018-06-29 NOTE — Progress Notes (Signed)
   Subjective   Patient ID: Samantha Lloyd    DOB: Jan 18, 1966, 52 y.o. female   MRN: [id number]  CC: "Physical exam"  HPI: Samantha Lloyd is a 52 y.o. female who presents to clinic today for the following:  Annual physical: Patient is here today for her annual physical.  She has no complaints today.  She would like to have her flu shot.  She reports getting her colonoscopy performed last year with "one polyp and was told to follow-up in 10 years."  She is also due for her Pap smear.  Patient denies history of fevers or chills, fatigue or syncope, nausea or vomiting, chest pain, shortness of breath, abdominal pain, motor weakness or sensory loss, headache.  Cervical cancer screening: She is here today for Pap smear.  Last Pap with normal cytology and negative high-risk HPV in February 2016.   STD check: Patient reports having sexual intercourse with a female partner a few months ago without use of protection.  This is not routine for her but she is unsure if she has any STD exposures but would like to be tested today during her Pap smear.  She denies vaginal bleeding or discharge.  ROS: see HPI for pertinent.  [place]: Fibroids, hot flashes.  Surgical history unremarkable.  Family history unremarkable. Smoking status reviewed. Medications reviewed.  Objective   BP 110/80   Pulse 76   Temp 98 F (36.7 C)   Wt 141 lb 12.8 oz (64.3 kg)   LMP  (LMP Unknown)   SpO2 98%   BMI 24.34 kg/m  Vitals and nursing note reviewed.  General: well nourished, well developed, NAD with non-toxic appearance HEENT: normocephalic, atraumatic, moist mucous membranes Neck: supple, non-tender without lymphadenopathy Cardiovascular: regular rate and rhythm without murmurs, rubs, or gallops Lungs: clear to auscultation bilaterally with normal work of breathing Abdomen: soft, non-tender, non-distended, normoactive bowel sounds GU: accompanied by chaperone, no external lesions or rashes, no bleeding or vaginal  discharge, cervical os without friability, no adnexal tenderness Skin: warm, dry, no rashes or lesions, cap refill < 2 seconds Extremities: warm and well perfused, normal tone, no edema  Assessment & Plan   High risk heterosexual behavior Here today for Pap smear.  Reports unprotected sexual intercourse a few months ago.  Asymptomatic for STI.   - Pap smear performed - Checking GC/chlamydia, wet prep, HIV, RPR - Discussed safe sex practice  Orders Placed This Encounter  Procedures  . Flu Vaccine QUAD 36+ mos IM  . CBC with Differential/Platelet  . CMP14+EGFR  . HIV Antibody (routine testing w rflx)  . RPR  . POCT Wet Prep [facility])   No orders of the defined types were placed in this encounter.   Harriet Butte, [place], PGY-3 07/01/2018, 8:11 AM

## 2018-06-29 NOTE — Patient Instructions (Signed)
Thank you for coming in to see [place] today. Please see below to review our plan for today's visit.  I will call you in the next 24-48 hours if there are any abnormalities in your labs, otherwise you should receive results in the mail.  Now up-to-date on your flu shot, and Pap smear.  Please call the clinic at [phone] if your symptoms worsen or you have any concerns. It was our pleasure to serve you.  Harriet Butte, [place], PGY-3

## 2018-06-30 LAB — CMP14+EGFR
ALT: 14 IU/L (ref 0–32)
AST: 18 IU/L (ref 0–40)
Albumin/Globulin Ratio: 1.8 (ref 1.2–2.2)
Albumin: 4.2 g/dL (ref 3.5–5.5)
Alkaline Phosphatase: 99 IU/L (ref 39–117)
BUN/Creatinine Ratio: 17 (ref 9–23)
BUN: 13 mg/dL (ref 6–24)
Bilirubin Total: 0.4 mg/dL (ref 0.0–1.2)
CALCIUM: 10.1 mg/dL (ref 8.7–10.2)
CO2: 26 mmol/L (ref 20–29)
CREATININE: 0.78 mg/dL (ref 0.57–1.00)
Chloride: 102 mmol/L (ref 96–106)
GFR calc Af Amer: 100 mL/min/{1.73_m2} (ref >59)
GFR, EST NON AFRICAN AMERICAN: 87 mL/min/{1.73_m2} (ref >59)
GLUCOSE: 74 mg/dL (ref 65–99)
Globulin, Total: 2.4 g/dL (ref 1.5–4.5)
Potassium: 4.3 mmol/L (ref 3.5–5.2)
Sodium: 142 mmol/L (ref 134–144)
Total Protein: 6.6 g/dL (ref 6.0–8.5)

## 2018-06-30 LAB — CBC WITH DIFFERENTIAL/PLATELET
BASOS ABS: 0.1 10*3/uL (ref 0.0–0.2)
Basos: 1 %
EOS (ABSOLUTE): 0.1 10*3/uL (ref 0.0–0.4)
EOS: 1 %
HEMATOCRIT: 39.4 % (ref 34.0–46.6)
Hemoglobin: 12.6 g/dL (ref 11.1–15.9)
IMMATURE GRANULOCYTES: 0 %
Immature Grans (Abs): 0 10*3/uL (ref 0.0–0.1)
Lymphocytes Absolute: 1.6 10*3/uL (ref 0.7–3.1)
Lymphs: 38 %
MCH: 28.4 pg (ref 26.6–33.0)
MCHC: 32 g/dL (ref 31.5–35.7)
MCV: 89 fL (ref 79–97)
Monocytes Absolute: 0.2 10*3/uL (ref 0.1–0.9)
Monocytes: 5 %
NEUTROS PCT: 55 %
Neutrophils Absolute: 2.3 10*3/uL (ref 1.4–7.0)
PLATELETS: 237 10*3/uL (ref 150–450)
RBC: 4.44 x10E6/uL (ref 3.77–5.28)
RDW: 12.1 % — AB (ref 12.3–15.4)
WBC: 4.2 10*3/uL (ref 3.4–10.8)

## 2018-06-30 LAB — HIV ANTIBODY (ROUTINE TESTING W REFLEX): HIV Screen 4th Generation wRfx: NONREACTIVE

## 2018-06-30 LAB — CERVICOVAGINAL ANCILLARY ONLY
CHLAMYDIA, DNA PROBE: NEGATIVE
Neisseria Gonorrhea: NEGATIVE

## 2018-06-30 LAB — RPR: RPR: NONREACTIVE

## 2018-07-01 ENCOUNTER — Encounter: Payer: Self-pay | Admitting: Family Medicine

## 2018-07-01 DIAGNOSIS — Z7251 High risk heterosexual behavior: Secondary | ICD-10-CM | POA: Insufficient documentation

## 2018-07-01 DIAGNOSIS — Z0000 Encounter for general adult medical examination without abnormal findings: Secondary | ICD-10-CM | POA: Insufficient documentation

## 2018-07-01 LAB — CYTOLOGY - PAP
DIAGNOSIS: NEGATIVE
HPV: NOT DETECTED

## 2018-07-01 NOTE — Assessment & Plan Note (Signed)
Here today for Pap smear.  Reports unprotected sexual intercourse a few months ago.  Asymptomatic for STI.   - Pap smear performed - Checking GC/chlamydia, wet prep, HIV, RPR - Discussed safe sex practice

## 2018-07-02 ENCOUNTER — Encounter: Payer: Self-pay | Admitting: Family Medicine

## 2018-07-09 ENCOUNTER — Ambulatory Visit: Payer: Managed Care, Other (non HMO) | Admitting: Podiatry

## 2018-08-28 ENCOUNTER — Encounter: Payer: Self-pay | Admitting: Family Medicine

## 2018-09-07 ENCOUNTER — Ambulatory Visit (INDEPENDENT_AMBULATORY_CARE_PROVIDER_SITE_OTHER): Payer: POS | Admitting: Family Medicine

## 2018-09-07 VITALS — BP 112/64 | Temp 98.0°F | Wt 142.0 lb

## 2018-09-07 DIAGNOSIS — J069 Acute upper respiratory infection, unspecified: Secondary | ICD-10-CM | POA: Diagnosis not present

## 2018-09-07 NOTE — Patient Instructions (Addendum)
Thank you for coming to see me today. It was a pleasure! Today we talked about:   You have a cold and it should start to get better about 7 - 10 days after it started.    If you develop cough, try honey scheduled, may use in tea. You may also try guaifenesin to help if you start having congestion.    Some other therapies you can try are: push fluids, rest, gargle warm salt water, use vaporizer or mist prn and return office visit prn if symptoms persist or worsen.   Drinking warm liquids such as teas and soups can help with secretions and cough. A mist humidifier or vaporizer can work well to help with secretions and cough.  It is very important to clean the humidifier between use according to the instructions.    It was good to see you.  If you're still having trouble in the next week, come back and see [place].    Of course, if you start having trouble breathing, worsening fevers, vomiting and unable to hold down any fluids, or you have other concerns, don't hesitate to come back or go to the ED after hours.   Please follow-up as needed.  If you have any questions or concerns, please do not hesitate to call the office at [phone].  Take Care,   [place] Greg Eckrich, DO

## 2018-09-07 NOTE — Assessment & Plan Note (Signed)
Exam consistent with start of URI, only with symptoms for 1 day. No fevers, chills, rigors, sore throat concerning for influenza like illness. Overall pt is well appearing, well hydrated, without respiratory distress. Discussed symptomatic treatment - continue to monitor for fevers  - continue Tylenol/ Motrin as needed for discomfort - nasal saline to help with his nasal congestion - Use a cool mist humidifier at bedtime to help with breathing - Stressed hydration - Honey for cough - Discussed return precautions, understanding voiced

## 2018-09-07 NOTE — Progress Notes (Signed)
  Subjective:    Patient ID: Samantha Lloyd, female    DOB: 13-Jun-1966, 53 y.o.   MRN: [id number]   CC: Cold-like symptoms  HPI:  Viral URI Patient reports that she was around her grand child who was sick with fevers and vomiting over the weekend.  Then reports that yesterday she went to a funeral for her niece.  Patient reports that overall she feels exhausted and tired.  However starting yesterday she felt as if she was also developing cold-like symptoms. Patient denies any fevers or chills.  Reports that she has had a tickle in the back of her throat is not having trouble swallowing.  Patient denies any nausea or vomiting, cough, ear pain.  She does endorse some body aches and runny nose.  Smoking status reviewed  ROS: 10 point ROS is otherwise negative, except as mentioned in HPI  Patient Active Problem List   Diagnosis Date Noted  . High risk heterosexual behavior 07/01/2018  . Encounter for annual physical exam 07/01/2018  . Viral URI 10/24/2017  . Colon cancer screening 04/25/2017  . Acrochordon 12/22/2015  . Loss of weight 12/22/2015  . Hot flashes 06/27/2011  . Leiomyoma of uterus 09/27/2008     Objective:  BP 112/64   Temp 98 F (36.7 C) (Oral)   Wt 142 lb (64.4 kg)   LMP  (LMP Unknown)   BMI 24.37 kg/m  Vitals and nursing note reviewed  General: NAD, pleasant HEENT: Normal TMs bilaterally, normal oropharynx with no signs of erythema or exudate.  No cervical lymphadenopathy Cardiac: RRR, normal heart sounds, no murmurs Respiratory: CTAB, normal effort Extremities: no edema or cyanosis. WWP. Skin: warm and dry, no rashes noted Neuro: alert and oriented, no focal deficits Psych: normal affect  Assessment & Plan:    Viral URI Exam consistent with start of URI, only with symptoms for 1 day. No fevers, chills, rigors, sore throat concerning for influenza like illness. Overall pt is well appearing, well hydrated, without respiratory distress. Discussed  symptomatic treatment - continue to monitor for fevers  - continue Tylenol/ Motrin as needed for discomfort - nasal saline to help with his nasal congestion - Use a cool mist humidifier at bedtime to help with breathing - Stressed hydration - Honey for cough - Discussed return precautions, understanding voiced   [place] Usiel Astarita, DO Family Medicine Resident PGY-2

## 2018-09-25 ENCOUNTER — Ambulatory Visit: Payer: POS | Admitting: Family Medicine

## 2019-02-24 ENCOUNTER — Other Ambulatory Visit: Payer: Self-pay

## 2019-02-24 ENCOUNTER — Telehealth: Payer: Self-pay | Admitting: *Deleted

## 2019-02-24 ENCOUNTER — Ambulatory Visit (INDEPENDENT_AMBULATORY_CARE_PROVIDER_SITE_OTHER): Payer: Managed Care, Other (non HMO)

## 2019-02-24 ENCOUNTER — Ambulatory Visit (INDEPENDENT_AMBULATORY_CARE_PROVIDER_SITE_OTHER): Payer: Managed Care, Other (non HMO) | Admitting: Podiatry

## 2019-02-24 DIAGNOSIS — S92911A Unspecified fracture of right toe(s), initial encounter for closed fracture: Secondary | ICD-10-CM | POA: Diagnosis not present

## 2019-02-24 DIAGNOSIS — M2011 Hallux valgus (acquired), right foot: Secondary | ICD-10-CM

## 2019-02-24 MED ORDER — IBUPROFEN 800 MG PO TABS: 800.0000 mg | ORAL_TABLET | Freq: Three times a day (TID) | ORAL | 1 refills | Status: DC | PRN

## 2019-02-24 NOTE — Telephone Encounter (Signed)
Pt called states she would like her medication ordered today sent to [facility] at [place]. Unable to leave message voicemail message states it is full.

## 2019-02-26 ENCOUNTER — Ambulatory Visit (INDEPENDENT_AMBULATORY_CARE_PROVIDER_SITE_OTHER): Payer: POS | Admitting: Family Medicine

## 2019-02-26 ENCOUNTER — Other Ambulatory Visit (HOSPITAL_COMMUNITY)
Admission: RE | Admit: 2019-02-26 | Discharge: 2019-02-26 | Disposition: A | Discharge status: Home / Self Care | Payer: POS | Source: Ambulatory Visit | Attending: Family Medicine | Admitting: Family Medicine

## 2019-02-26 ENCOUNTER — Other Ambulatory Visit: Payer: Self-pay

## 2019-02-26 VITALS — BP 110/80 | HR 89

## 2019-02-26 DIAGNOSIS — Z113 Encounter for screening for infections with a predominantly sexual mode of transmission: Secondary | ICD-10-CM | POA: Diagnosis not present

## 2019-02-26 DIAGNOSIS — Z7251 High risk heterosexual behavior: Secondary | ICD-10-CM | POA: Diagnosis not present

## 2019-02-26 NOTE — Patient Instructions (Signed)
It was great meeting you today!  Today we tested for gonorrhea, chlamydia, HIV, syphilis.  These also come back likely on Monday I will give you a call with those results.  If something comes back positive we will talk about treatment.  I have a great weekend and let us know if there is anything else we can do for you.

## 2019-02-27 LAB — RPR: RPR Ser Ql: NONREACTIVE

## 2019-02-27 LAB — HIV ANTIBODY (ROUTINE TESTING W REFLEX): HIV Screen 4th Generation wRfx: NONREACTIVE

## 2019-02-28 NOTE — Progress Notes (Signed)
   HPI: 53 y.o. female presenting today with a chief complaint of right foot pain that has been ongoing since her surgery on 06/13/2017. She reports pain to the right fourth toe. There are no modifying factors noted and she has not had any recent treatment. Patient is here for further evaluation and treatment.   No past medical history on file.   Physical Exam: General: The patient is alert and oriented x3 in no acute distress.  Dermatology: Skin is warm, dry and supple bilateral lower extremities. Negative for open lesions or macerations.  Vascular: Palpable pedal pulses bilaterally. No erythema noted. Capillary refill within normal limits.  Neurological: Epicritic and protective threshold grossly intact bilaterally.   Musculoskeletal Exam: Pain with palpation and edema noted to the right fourth toe. Range of motion within normal limits to all pedal and ankle joints bilateral. Muscle strength 5/5 in all groups bilateral.   Radiographic Exam:  Minimally displaced fracture of the proximal phalanx of the right fourth toe.    Assessment: 1. Fracture proximal phalanx right 4th toe   Plan of Care:  1. Patient evaluated. X-Rays reviewed.  2. Recommended good supportive shoes.  3. Prescription for Motrin 800 mg provided to patient.  4. Return to clinic as needed.      Edrick Kins, DPM [facility]  Dr. Edrick Kins, DPM    [address], [place] [address]                Office [phone]  Fax [phone]

## 2019-03-01 NOTE — Assessment & Plan Note (Signed)
1 episode of unprotected sexual intercourse.  Will get RPR, HIV, GC/media.  RPR HIV negative.  GC/chlamydia still pending.  Will update patient if positive.

## 2019-03-01 NOTE — Progress Notes (Signed)
   HPI 53 year old who presents for STD testing.  Patient states that she had a unprotected sexual encounter recently and she was get tested because she "feels dirty".  She is totally asymptomatic.  She has had no discharge, no itching, no sores.  CC: STD testing   ROS:   Review of Systems See HPI for ROS.   CC, SH/smoking status, and VS noted  Objective: BP 110/80   Pulse 89   LMP  (LMP Unknown)   SpO2 58%  Gen: 53 year old African female, no acute distress, resting comfortably, very pleasant CV: RRR, no murmur Resp: CTAB, no wheezes, non-labored Abd: SNTND, BS present, no guarding or organomegaly Neuro: Alert and oriented, Speech clear, No gross deficits GU: Speculum inserted easily.  Cervix well visualized.  No foul odor appreciated.  No discharge seen.   Assessment and plan:  High risk heterosexual behavior 1 episode of unprotected sexual intercourse.  Will get RPR, HIV, GC/media.  RPR HIV negative.  GC/chlamydia still pending.  Will update patient if positive.   Orders Placed This Encounter  Procedures  . RPR  . HIV antibody (with reflex)    No orders of the defined types were placed in this encounter.    Guadalupe Dawn MD PGY-3 Family Medicine Resident  03/01/2019 11:51 AM

## 2019-03-02 LAB — CERVICOVAGINAL ANCILLARY ONLY
Chlamydia: NEGATIVE
Neisseria Gonorrhea: NEGATIVE

## 2019-03-03 ENCOUNTER — Telehealth: Payer: Self-pay | Admitting: Family Medicine

## 2019-03-03 NOTE — Telephone Encounter (Signed)
Called patient and informed her that her std testing was negative. All questions answered  Samantha Dawn MD PGY-2 Family Medicine Resident

## 2019-05-17 ENCOUNTER — Other Ambulatory Visit: Payer: Self-pay

## 2019-05-17 ENCOUNTER — Encounter: Payer: Self-pay | Admitting: Family Medicine

## 2019-05-17 ENCOUNTER — Ambulatory Visit (INDEPENDENT_AMBULATORY_CARE_PROVIDER_SITE_OTHER): Payer: POS | Admitting: Family Medicine

## 2019-05-17 VITALS — BP 110/68 | HR 72 | Wt 137.8 lb

## 2019-05-17 DIAGNOSIS — G4700 Insomnia, unspecified: Secondary | ICD-10-CM

## 2019-05-17 DIAGNOSIS — Z23 Encounter for immunization: Secondary | ICD-10-CM | POA: Diagnosis not present

## 2019-05-17 DIAGNOSIS — F5101 Primary insomnia: Secondary | ICD-10-CM | POA: Diagnosis not present

## 2019-05-17 DIAGNOSIS — R5383 Other fatigue: Secondary | ICD-10-CM

## 2019-05-17 DIAGNOSIS — D649 Anemia, unspecified: Secondary | ICD-10-CM | POA: Diagnosis not present

## 2019-05-17 MED ORDER — MELATONIN 1 MG PO CAPS: 1.0000 mg | ORAL_CAPSULE | Freq: Every day | ORAL | 2 refills | Status: DC

## 2019-05-17 NOTE — Progress Notes (Signed)
     Subjective: Chief Complaint  Patient presents with  . Fatigue    HPI: Samantha Lloyd is a 53 y.o. presenting to clinic today to discuss the following:  Fatigue Patient endorsing ongoing fatigue for some time. She states she has been separated for about 3 years but just now is working through the official paperwork. For the past several months she has been feeling a generalized "lack of energy". She denies heat or cold intolerance, loss of hair, skin changes, no diarrhea or constipation. She overall, just feels a lack of energy. She does not feel down or depressed.  Insomnia She also has difficulty initiating sleep. No known history of snoring. She doesn't wake up from sleep just lays in bed for long hours. She has never had a sleep study. She does watch TV at night in bed but no loud music. Sometimes she will read in bed.  ROS noted in HPI.    Social History   Tobacco Use  Smoking Status Never Smoker  Smokeless Tobacco Never Used   Objective: BP 110/68   Pulse 72   Wt 137 lb 12.8 oz (62.5 kg)   LMP  (LMP Unknown)   SpO2 98%   BMI 23.65 kg/m  Vitals and nursing notes reviewed  Physical Exam Gen: Alert and Oriented x 3, NAD HEENT: Normocephalic, atraumatic CV: RRR, no murmurs, normal S1, S2 split Resp: CTAB, no wheezing, rales, or rhonchi, comfortable work of breathing Abd: non-distended, non-tender, soft, +bs in all four quadrants Ext: no clubbing, cyanosis, or edema Skin: warm, dry, intact, no rashes  Assessment/Plan:  Insomnia unlcear as to the cause but do feel like its worth a sleep study to evaluate for OSA since she has never had one. - referral to sleep medicine - Melatonin OTC, titrate up to 5mg  per night if needed  Fatigue Vague for fatigue with wide differential; hypothryoid, anemia need to be ruled out. She has no history of thyroid disease. - TSH and free T4 - CBC and iron studies   PATIENT EDUCATION PROVIDED: See AVS    Diagnosis and plan  along with any newly prescribed medication(s) were discussed in detail with this patient today. The patient verbalized understanding and agreed with the plan. Patient advised if symptoms worsen return to clinic or ER.   Health Maintainance: influenza vaccine   Orders Placed This Encounter  Procedures  . Flu Vaccine QUAD 36+ mos IM  . TSH + free T4  . CBC  . Iron, TIBC and Ferritin Panel  . Ambulatory referral to Sleep Studies    Referral Priority:   Routine    Referral Type:   Consultation    Referral Reason:   Specialty Services Required    Number of Visits Requested:   1    Meds ordered this encounter  Medications  . Melatonin 1 MG CAPS    Sig: Take 1 capsule (1 mg total) by mouth at bedtime.    Dispense:  30 capsule    Refill:  [place], DO 05/17/2019, 2:59 PM PGY-3 [place]

## 2019-05-17 NOTE — Patient Instructions (Addendum)
It was great to meet you today! Thank you for letting me participate in your care!  Today, we discussed your low energy and poor sleep. It could be due to several conditions. We will get some lab work today and if it is abnormal I will call you. I have also referred you to a sleep study to evaluate you for OSA or obstructive sleep apnea. Lastly, I have started you on Melatonin to help you with sleep. Please practice good sleep hygiene as we discussed and please follow up with [place] in clinic if your symptoms are not improved.  Be well, Harolyn Rutherford, DO PGY-3, [facility]

## 2019-05-18 LAB — CBC
Hematocrit: 41 % (ref 34.0–46.6)
Hemoglobin: 13.6 g/dL (ref 11.1–15.9)
MCH: 29 pg (ref 26.6–33.0)
MCHC: 33.2 g/dL (ref 31.5–35.7)
MCV: 87 fL (ref 79–97)
Platelets: 243 10*3/uL (ref 150–450)
RBC: 4.69 x10E6/uL (ref 3.77–5.28)
RDW: 12.6 % (ref 11.7–15.4)
WBC: 5.4 10*3/uL (ref 3.4–10.8)

## 2019-05-18 LAB — IRON,TIBC AND FERRITIN PANEL
Ferritin: 181 ng/mL — ABNORMAL HIGH (ref 15–150)
Iron Saturation: 10 % — ABNORMAL LOW (ref 15–55)
Iron: 28 ug/dL (ref 27–159)
Total Iron Binding Capacity: 276 ug/dL (ref 250–450)
UIBC: 248 ug/dL (ref 131–425)

## 2019-05-18 LAB — TSH+FREE T4
Free T4: 1.15 ng/dL (ref 0.82–1.77)
TSH: 0.704 u[IU]/mL (ref 0.450–4.500)

## 2019-05-26 DIAGNOSIS — G4700 Insomnia, unspecified: Secondary | ICD-10-CM | POA: Insufficient documentation

## 2019-05-26 NOTE — Assessment & Plan Note (Addendum)
Discussed good sleep hygiene. Unlcear as to the cause but do feel like its worth a sleep study to evaluate for OSA since she has never had one. - referral to sleep medicine - Melatonin OTC, titrate up to 5mg  per night if needed

## 2019-05-26 NOTE — Assessment & Plan Note (Signed)
Vague for fatigue with wide differential; hypothryoid, anemia need to be ruled out. She has no history of thyroid disease. - TSH and free T4 - CBC and iron studies

## 2019-06-08 ENCOUNTER — Other Ambulatory Visit: Payer: Self-pay | Admitting: Family Medicine

## 2019-06-08 DIAGNOSIS — Z1231 Encounter for screening mammogram for malignant neoplasm of breast: Secondary | ICD-10-CM

## 2019-06-09 ENCOUNTER — Other Ambulatory Visit: Payer: Self-pay | Admitting: Family Medicine

## 2019-06-15 ENCOUNTER — Other Ambulatory Visit: Payer: Self-pay | Admitting: Podiatry

## 2019-07-27 ENCOUNTER — Ambulatory Visit
Admission: RE | Admit: 2019-07-27 | Discharge: 2019-07-27 | Disposition: A | Discharge status: Home / Self Care | Payer: POS | Source: Ambulatory Visit | Attending: Family Medicine | Admitting: Family Medicine

## 2019-07-27 ENCOUNTER — Other Ambulatory Visit: Payer: Self-pay

## 2019-07-27 DIAGNOSIS — Z1231 Encounter for screening mammogram for malignant neoplasm of breast: Secondary | ICD-10-CM

## 2019-07-28 ENCOUNTER — Other Ambulatory Visit: Payer: Self-pay | Admitting: Family Medicine

## 2019-07-28 DIAGNOSIS — R928 Other abnormal and inconclusive findings on diagnostic imaging of breast: Secondary | ICD-10-CM

## 2019-08-18 ENCOUNTER — Other Ambulatory Visit: Payer: Self-pay

## 2019-08-18 ENCOUNTER — Ambulatory Visit: Payer: POS

## 2019-08-18 ENCOUNTER — Ambulatory Visit
Admission: RE | Admit: 2019-08-18 | Discharge: 2019-08-18 | Disposition: A | Discharge status: Home / Self Care | Payer: POS | Source: Ambulatory Visit | Attending: Family Medicine | Admitting: Family Medicine

## 2019-08-18 DIAGNOSIS — R928 Other abnormal and inconclusive findings on diagnostic imaging of breast: Secondary | ICD-10-CM

## 2019-09-27 IMAGING — MG DIGITAL SCREENING BILATERAL MAMMOGRAM WITH CAD
4 series · 4 of 4 positions shown · non-contrast
Comparison: Previous exam(s).

CLINICAL DATA: Screening.

EXAM:
DIGITAL SCREENING BILATERAL MAMMOGRAM WITH CAD

[R CC]
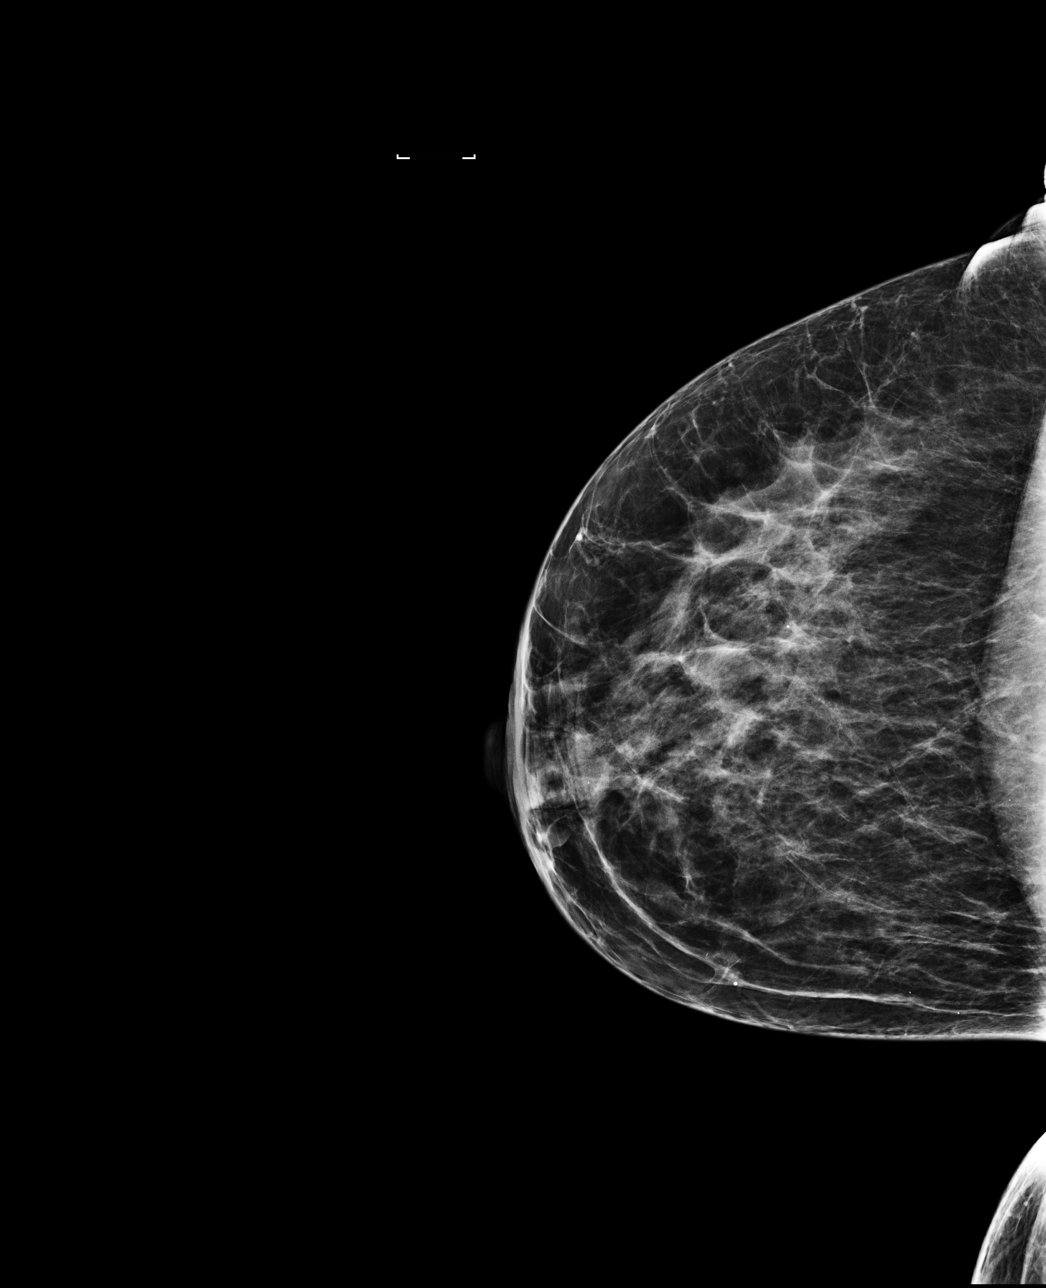

[L CC]
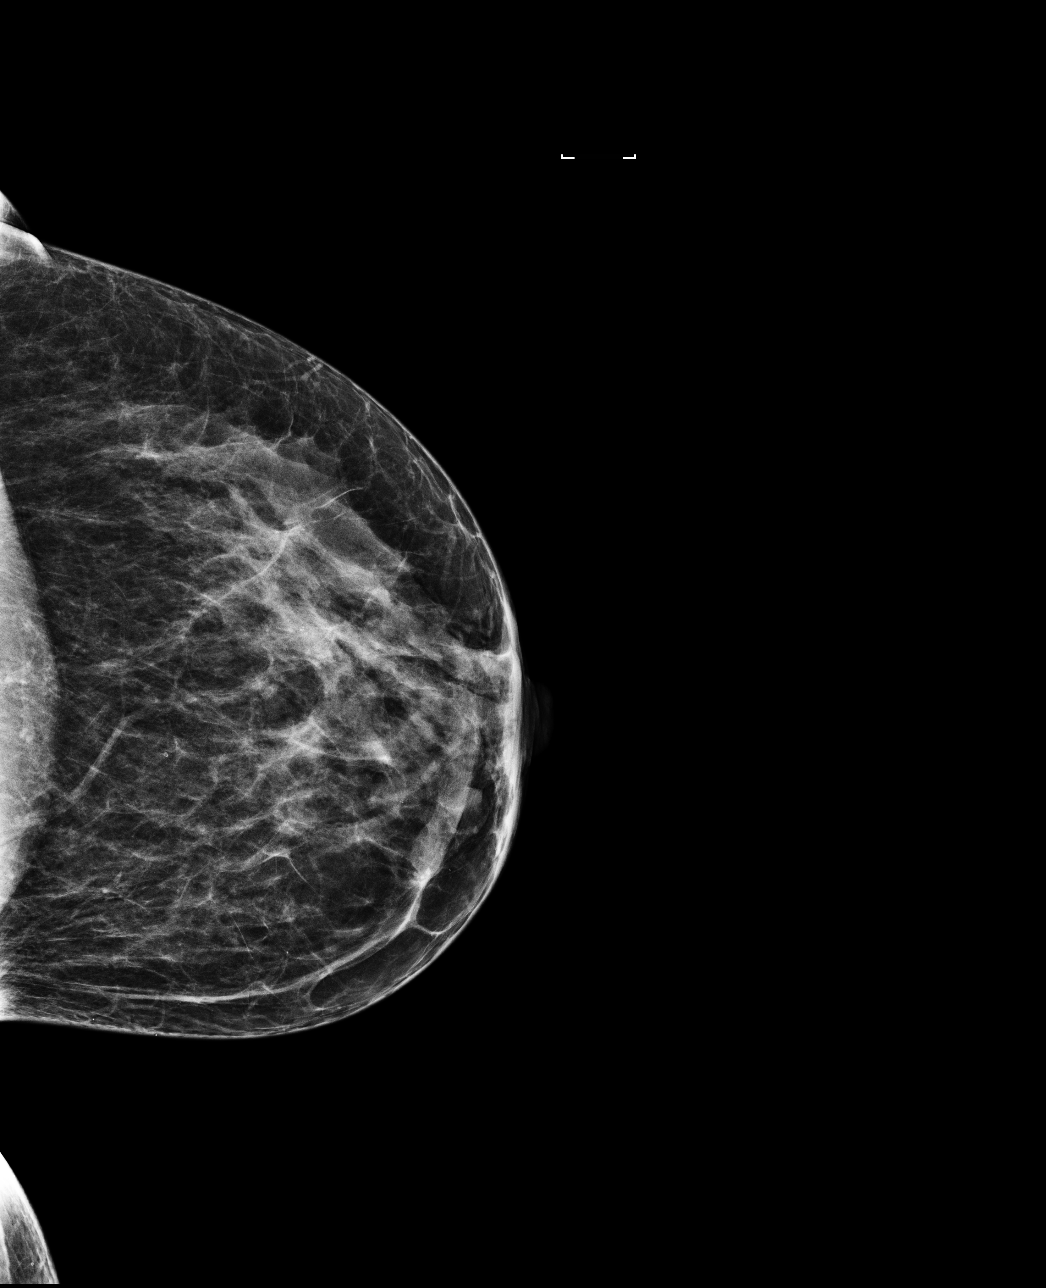

[R MLO]
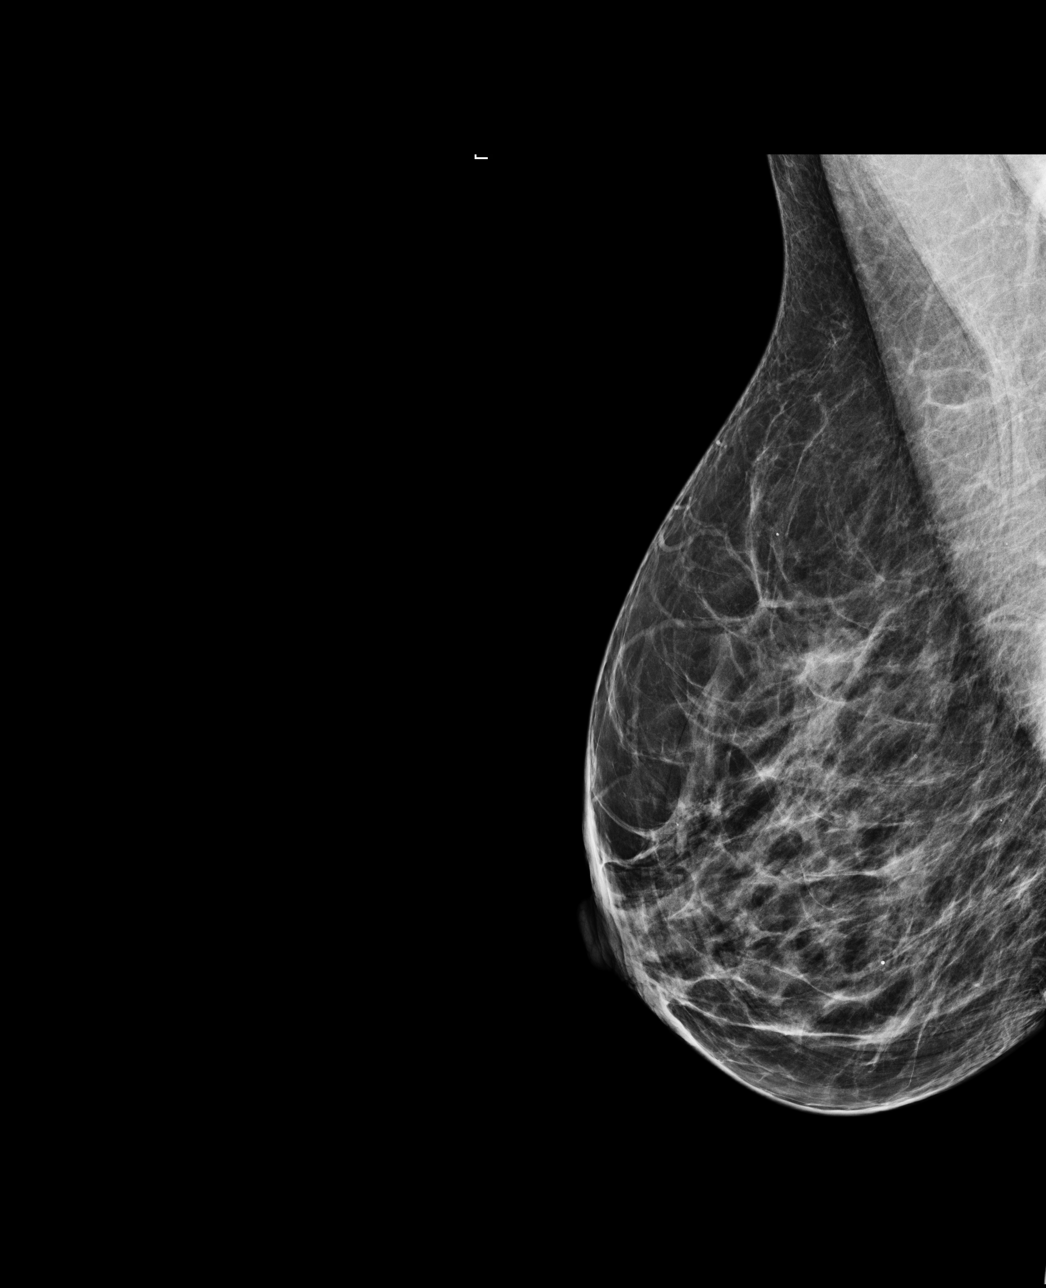

[L MLO]
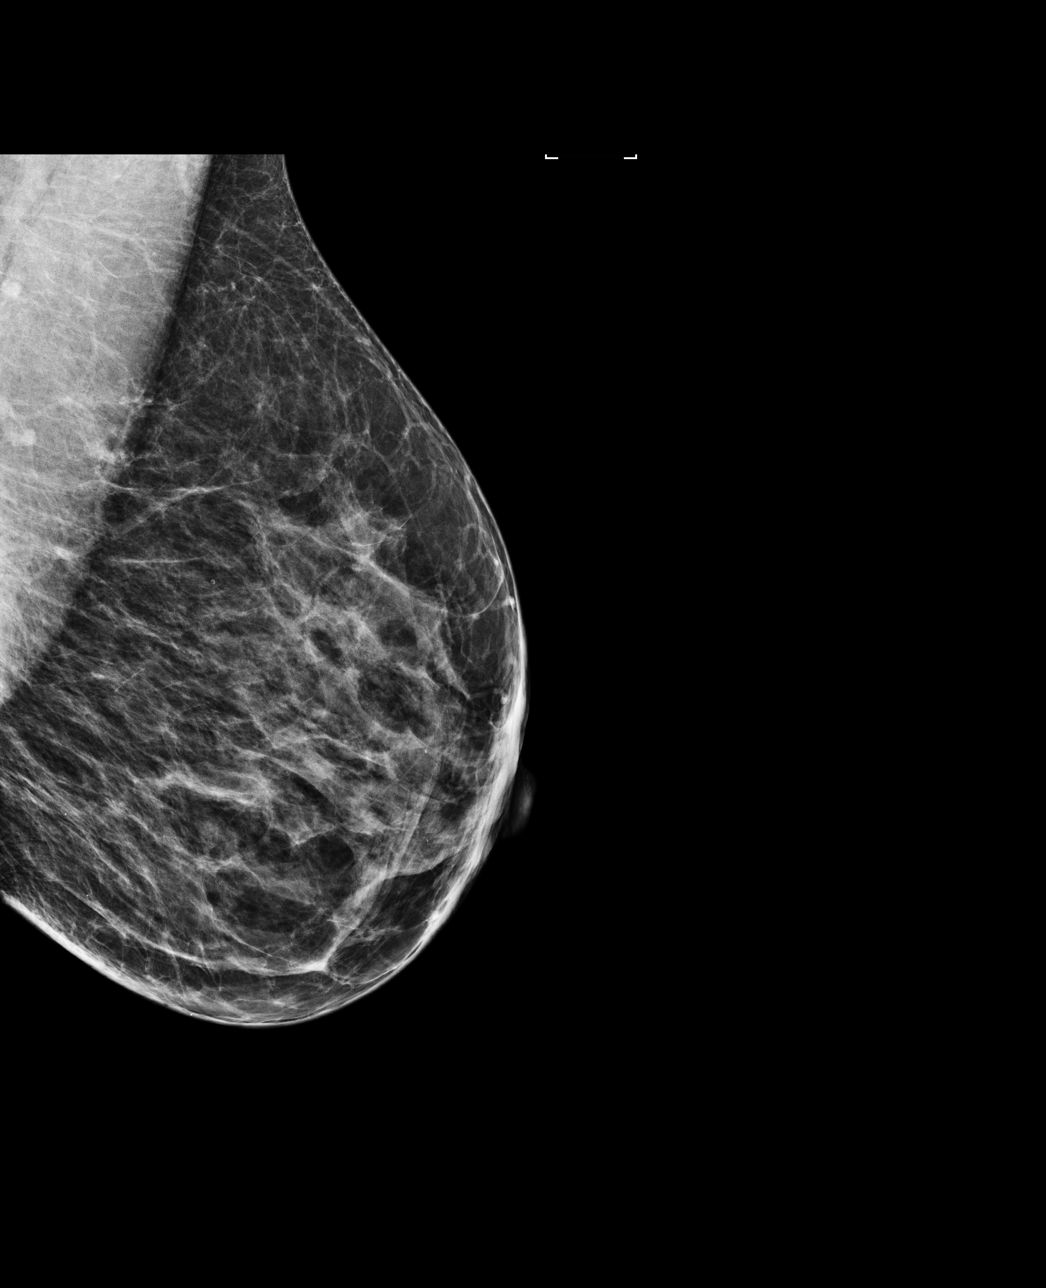

[4 of 4 positions shown; findings below may reference images not displayed]

ACR Breast Density Category b: There are scattered areas of
fibroglandular density.
FINDINGS: There are no findings suspicious for malignancy. Images were
processed with CAD.
IMPRESSION: No mammographic evidence of malignancy. A result letter of this
screening mammogram will be mailed directly to the patient.

RECOMMENDATION:
Screening mammogram in one year. (Code:AS-G-LCT)

BI-RADS CATEGORY  1: Negative.

## 2020-03-21 ENCOUNTER — Ambulatory Visit: Payer: POS

## 2020-07-26 ENCOUNTER — Encounter: Payer: Self-pay | Admitting: Family Medicine

## 2020-07-26 ENCOUNTER — Ambulatory Visit (INDEPENDENT_AMBULATORY_CARE_PROVIDER_SITE_OTHER): Payer: POS | Admitting: Family Medicine

## 2020-07-26 ENCOUNTER — Other Ambulatory Visit: Payer: Self-pay

## 2020-07-26 VITALS — BP 92/60 | HR 78 | Ht 64.0 in | Wt 152.5 lb

## 2020-07-26 DIAGNOSIS — Z1159 Encounter for screening for other viral diseases: Secondary | ICD-10-CM

## 2020-07-26 DIAGNOSIS — R5383 Other fatigue: Secondary | ICD-10-CM | POA: Diagnosis not present

## 2020-07-26 DIAGNOSIS — Z114 Encounter for screening for human immunodeficiency virus [HIV]: Secondary | ICD-10-CM

## 2020-07-26 NOTE — Assessment & Plan Note (Signed)
She presents to clinic with vague symptoms of fatigue and malaise.  It is difficult to pinpoint the most troublesome symptom.  We will start with general labs for fatigue.  If labs are generally unremarkable, this would be most likely due to depression.  She was given resources today to get connected with a therapist in the community.  If her labs returned totally normal, I will call and discuss starting an SSRI. -Follow-up BMP, CBC, TSH, hep C, HIV (HIV needed to be postponed until future visit due to difficulty drawing)

## 2020-07-26 NOTE — Progress Notes (Addendum)
    SUBJECTIVE:   CHIEF COMPLAINT / HPI:   Fatigue Samantha Lloyd presents to clinic today with 2-3 months of general fatigue and malaise.  She notes that she simply has not felt well for the past several months.  She has some difficulty sleeping despite taking measures to use good sleep hygiene to get to bed early, she often wakes early in the morning unable to fall back asleep.  She also notes that her appetite seems to have decreased a little bit but that she has been gaining weight.  She notes that roughly 15 pound weight gain in the past 3 months.  Her daughter was recently diagnosed with hypothyroidism.  Her PHQ-9 score was eleven today with a zero for question nine.  Overall, she does think that she has been more stressed and down than usual.  PERTINENT  PMH / PSH: Previous history of weight loss and fatigue.  OBJECTIVE:   BP 92/60 Comment: Rt arm  Pulse 78   Ht 5\' 4"  (1.626 m)   Wt 152 lb 8 oz (69.2 kg)   LMP  (LMP Unknown)   SpO2 98%   BMI 26.18 kg/m    General: Alert and cooperative and appears to be in no acute distress HEENT: Neck non-tender without lymphadenopathy, masses or thyromegaly.  Mildly pale conjunctiva. Cardio: Normal S1 and S2, no S3 or S4. Rhythm is regular. No murmurs or rubs.   Pulm: Clear to auscultation bilaterally, no crackles, wheezing, or diminished breath sounds. Normal respiratory effort Abdomen: Bowel sounds normal. Abdomen soft and non-tender.  Extremities: No peripheral edema. Warm/ well perfused.  Strong radial pulse. Neuro: Cranial nerves grossly intact  ASSESSMENT/PLAN:   Fatigue She presents to clinic with vague symptoms of fatigue and malaise.  It is difficult to pinpoint the most troublesome symptom.  We will start with general labs for fatigue.  If labs are generally unremarkable, this would be most likely due to depression.  She was given resources today to get connected with a therapist in the community.  If her labs returned totally  normal, I will call and discuss starting an SSRI. -Follow-up BMP, CBC, TSH, hep C, HIV (HIV needed to be postponed until future visit due to difficulty drawing)     Matilde Haymaker, MD [place]

## 2020-07-26 NOTE — Patient Instructions (Signed)
Fatigue: Today we will get a general lab work to see if there is any significant cause for fatigue and generalized.  If your lab work comes back normal, I think your symptoms may be related to depression.  Mild depression: For now, I think it would be very reasonable for you to reach out to establish care with a therapist.  Please see the resources below to see a list of providers in our community.  You can also use the website noted to find which providers would be covered by your insurance.  If all of your lab work comes back normal, I will give you a call to discuss starting medicine.   Therapy and Counseling Resources Most providers on this list will take Medicaid. Patients with commercial insurance or Medicare should contact their insurance company to get a list of in network providers.  BestDay:Psychiatry and Counseling [address] [facility] [place]. [place], [place] [address] [place]  [address] [place], [place] [address]      [place] [address]  [place], [place] [address] [phone]  [place] [address], [place]  [place], [place] [address]       [phone]      Jinny Blossom [facility] [address], [place], [place]  Family Solutions:  [place]. [place] [phone]  [facility]:  [place] [address] [place] [phone]  [facility] (under & uninsured) [address], [place] [phone]    [email or web address]    Gordon McCoole [address] . [place]    [phone]  [facility] [place]     Phone:  [phone]     [place]  [place] [address]      [place], [place] ext [place]: [place], [place], [place]   [place] ([place] therapist) [email or web address]  [place] [address]   [place] [address]    [phone]    [facility]   [address],  [place], [place]   [place]  [place]  [facility]  [address] [place], [place]        [phone]  Open Access/Walk In Clinic under & uninsured  [facility]  [address] [place], [place] Crisis [phone]  [facility] [place],  ([place])   [place], [place]: [phone]) 8:30 - 12; 1 - 2:30  [facility] of the [place],  [place], [place]    [phone]):8:30 - 12; 2 - 3PM  [facility] [employer],  [address]  [place]; [phone]):   Mon - Fri 8 AM - 5 PM  [facility] [place]  MWF 12:30 to 3:00 or call to schedule an appointment  [phone]  Specific Provider options Psychology Today  [email or web address] 1. click on find a therapist  2. enter your zip code 3. left side and select or tailor a therapist for your specific need.   [facility] Provider Directory [email or web address]  (Medicaid)   Follow all drop  down to find a provider  [place] [phone] or [email or web address] [address], [place], [place] Recovery support and educational   24- Hour Availability:  .  Marland Kitchen [facility]  . [place], [place] Crisis [phone]  . Family Service of the [employer] [phone]  [facility] Crisis Service  [phone]   . [employer]  [phone] (after hours)  . Therapeutic Alternative/Mobile Crisis   [phone]  . [place]  National Suicide Hotline  2692782280 ([place])  . Call 911 or go to emergency room  . [employer]  [phone]);  [place] and [employer]   . [facility] ACCESS  [phone]); [place], [place], [place], [place], [place], [place], [place]

## 2020-07-27 LAB — CBC
Hematocrit: 39 % (ref 34.0–46.6)
Hemoglobin: 13.2 g/dL (ref 11.1–15.9)
MCH: 29.7 pg (ref 26.6–33.0)
MCHC: 33.8 g/dL (ref 31.5–35.7)
MCV: 88 fL (ref 79–97)
Platelets: 229 10*3/uL (ref 150–450)
RBC: 4.45 x10E6/uL (ref 3.77–5.28)
RDW: 12 % (ref 11.7–15.4)
WBC: 4.9 10*3/uL (ref 3.4–10.8)

## 2020-07-27 LAB — FERRITIN
Ferritin: 150 ng/mL (ref 15–150)
Ferritin: 164 ng/mL — ABNORMAL HIGH (ref 15–150)

## 2020-07-27 LAB — BASIC METABOLIC PANEL
BUN/Creatinine Ratio: 23 (ref 9–23)
BUN: 17 mg/dL (ref 6–24)
CO2: 24 mmol/L (ref 20–29)
Calcium: 10.2 mg/dL (ref 8.7–10.2)
Chloride: 105 mmol/L (ref 96–106)
Creatinine, Ser: 0.73 mg/dL (ref 0.57–1.00)
GFR calc Af Amer: 107 mL/min/{1.73_m2} (ref >59)
GFR calc non Af Amer: 93 mL/min/{1.73_m2} (ref >59)
Glucose: 90 mg/dL (ref 65–99)
Potassium: 4 mmol/L (ref 3.5–5.2)
Sodium: 146 mmol/L — ABNORMAL HIGH (ref 134–144)

## 2020-07-27 LAB — TSH: TSH: 0.554 u[IU]/mL (ref 0.450–4.500)

## 2020-07-27 LAB — HCV AB W REFLEX TO QUANT PCR: HCV Ab: 0.2 s/co ratio (ref 0.0–0.9)

## 2020-07-27 LAB — HCV INTERPRETATION

## 2020-07-28 ENCOUNTER — Other Ambulatory Visit: Payer: Self-pay | Admitting: Family Medicine

## 2020-07-28 MED ORDER — FLUOXETINE HCL 20 MG PO TABS: 20.0000 mg | ORAL_TABLET | Freq: Every day | ORAL | 3 refills | Status: DC

## 2020-07-28 NOTE — Progress Notes (Signed)
Called this in-depth and informed her that her labs have returned normal.  Per our conversation from clinic, we did discuss starting an SSRI today.  She was agreeable and we decided to start Prozac 20 mg daily.  She is told to return to clinic in 1 month to see how she is responding to her new SSRI.

## 2020-10-31 ENCOUNTER — Other Ambulatory Visit: Payer: Self-pay | Admitting: Family Medicine

## 2020-10-31 DIAGNOSIS — Z1231 Encounter for screening mammogram for malignant neoplasm of breast: Secondary | ICD-10-CM

## 2020-12-07 ENCOUNTER — Ambulatory Visit: Payer: POS

## 2021-01-05 ENCOUNTER — Ambulatory Visit: Payer: POS | Admitting: Family Medicine

## 2021-01-05 ENCOUNTER — Ambulatory Visit (INDEPENDENT_AMBULATORY_CARE_PROVIDER_SITE_OTHER): Payer: POS | Admitting: Family Medicine

## 2021-01-05 ENCOUNTER — Other Ambulatory Visit: Payer: Self-pay

## 2021-01-05 VITALS — BP 110/80 | HR 83 | Ht 64.0 in | Wt 150.4 lb

## 2021-01-05 DIAGNOSIS — R3581 Nocturnal polyuria: Secondary | ICD-10-CM | POA: Diagnosis not present

## 2021-01-05 DIAGNOSIS — N393 Stress incontinence (female) (male): Secondary | ICD-10-CM | POA: Diagnosis not present

## 2021-01-05 LAB — POCT URINALYSIS DIP (MANUAL ENTRY)
Bilirubin, UA: NEGATIVE
Glucose, UA: NEGATIVE mg/dL
Leukocytes, UA: NEGATIVE
Nitrite, UA: NEGATIVE
Protein Ur, POC: NEGATIVE mg/dL
Spec Grav, UA: 1.025 (ref 1.010–1.025)
Urobilinogen, UA: 1 E.U./dL
pH, UA: 6 (ref 5.0–8.0)

## 2021-01-05 LAB — POCT UA - MICROSCOPIC ONLY

## 2021-01-05 NOTE — Patient Instructions (Addendum)
I suspect you have Stress Incontinence. I recommend Kegel exercises to help improve your pelvic floor muscles. I have also referred you to Pelvic Floor Rehab which has been very helpful for many people.  Try to avoid bladder irritants and go to the bathroom frequently. I am checking your urine to make sure you dont have an infection.  Read about Bladder Irritants: Certain foods and drinks have been associated with worsening symptoms of urinary frequency, urgency, urge incontinence, or bladder pain. If you suffer from any of these conditions, you may wish to try eliminating one or more of these foods from your diet and see if your symptoms improve. If bladder symptoms are related to dietary factors, strict adherence to a diet that  eliminates the food should bring marked relief in 10 days. Once you are feeling better, you can begin to add foods back into your diet, one at a time. If symptoms return, you will be able to identify the irritant. As you add foods back to your diet it is very important that you drink significant amounts of water. Low-acid fruit substitutions include apricots, papaya, pears and watermelon. Coffee drinkers can drink Kava or other lowacid instant drinks. Tea drinkers can substitute non-citrus herbal and sun brewed teas. Calcium carbonate co-buffered with calcium ascorbate can be substituted for Vitamin C. Prelief is a dietary supplement that works as an acid blocker for the  bladder.   List of Common Bladder Irritants*: -Alcoholic beverages  -Apples and apple juice  -Cantaloupe  -Carbonated beverages  -Chili and spicy foods  -Chocolate  -Citrus fruit  -Coffee (including decaffeinated)  -Cranberries and cranberry juice  -Grapes  -Guava  -Milk Products: milk, cheese, cottage cheese, yogurt, ice cream  -Peaches  -Pineapple  -Plums  -Strawberries  -Sugar especially artificial sweeteners, saccharin, aspartame, corn sweeteners, honey, fructose, sucrose, lactose  -Tea   -Tomatoes and tomato juice  -Vitamin B complex  -Vinegar  *Most people are not sensitive to ALL of these products; your goal is to find the foods that make YOUR symptoms worse

## 2021-01-05 NOTE — Progress Notes (Signed)
   Subjective:   Patient ID: Samantha Lloyd    DOB: [birth year], 55 y.o. female   MRN: [id number]  Samantha Lloyd is a 55 y.o. female with a history of hot flashes, leiomyoma of uterus, fatigue, insomnia, weight loss, skin tags here for leakage of urine  HPI: Patient presents today due to black bladder leakage x2 to 3 weeks, feels worse this week.  Leakage happens mostly when she coughs is, sneezes, works out.  She endorses occasional urgency. Does endorse nocturnal poluria requiring her to get out of bed 2-4 times a night.  Denies frequency, dysuria, pelvic pain, fevers, chills.  She is sexually active occasionally and denies any dyspareunia or dryness she does not take any meds.    Review of Systems:  Per HPI.   Objective:   BP 110/80   Pulse 83   Ht 5\' 4"  (1.626 m)   Wt 150 lb 6.4 oz (68.2 kg)   LMP  (LMP Unknown)   SpO2 98%   BMI 25.82 kg/m  Vitals and nursing note reviewed.  General: pleasant thin older woman, sitting comfortably in exam chair, well nourished, well developed, in no acute distress with non-toxic appearance Resp: breathing comfortably on room air, speaking in full sentences Skin: warm, dry, no rashes or lesions Extremities: warm and well perfused, normal tone, no edema MSK:  gait normal Neuro: Alert and oriented, speech normal  Assessment & Plan:   Stress incontinence in female History and 3IQ indicative of stress incontinence predominance. Recommended Kegel exercises. Referral to Pelvic Floor rehab. UA negative. Follow up if no improvemetn, would recommend pelvic exam at that time to evaluate for pelvic floor muscle integrity, vaginal atrophy, pelvic masses, and advanced pelvic organ prolapse beyond the hymen. Recommended to avoid bladder irritations. Patient voiced understanding and agreement with plan.   Nocturnal polyuria No history of risk factors that could be contributing such as CHF, T2DM, OSA. Denies LE edema. This did not appear significantly  bothersome to patient. Recommended observation and trying to avoid bladder irritants and excessive fluid intake 2 hours prior to bedtime.   - POC urine negative for infection.   Orders Placed This Encounter  Procedures  . Ambulatory referral to Physical Therapy    Referral Priority:   Routine    Referral Type:   Physical Medicine    Referral Reason:   Specialty Services Required    Requested Specialty:   Physical Therapy    Number of Visits Requested:   1  . POCT UA - Microscopic Only  . POCT urinalysis dipstick   No orders of the defined types were placed in this encounter.    Mina Marble, DO PGY-3, [facility] 01/07/2021 8:36 AM

## 2021-01-07 DIAGNOSIS — N393 Stress incontinence (female) (male): Secondary | ICD-10-CM | POA: Insufficient documentation

## 2021-01-07 DIAGNOSIS — R3581 Nocturnal polyuria: Secondary | ICD-10-CM | POA: Insufficient documentation

## 2021-01-07 NOTE — Assessment & Plan Note (Signed)
No history of risk factors that could be contributing such as CHF, T2DM, OSA. Denies LE edema. This did not appear significantly bothersome to patient. Recommended observation and trying to avoid bladder irritants and excessive fluid intake 2 hours prior to bedtime.   - POC urine negative for infection.

## 2021-01-07 NOTE — Assessment & Plan Note (Signed)
History and 3IQ indicative of stress incontinence predominance. Recommended Kegel exercises. Referral to Pelvic Floor rehab. UA negative. Follow up if no improvemetn, would recommend pelvic exam at that time to evaluate for pelvic floor muscle integrity, vaginal atrophy, pelvic masses, and advanced pelvic organ prolapse beyond the hymen. Recommended to avoid bladder irritations. Patient voiced understanding and agreement with plan.

## 2021-01-24 ENCOUNTER — Other Ambulatory Visit: Payer: Self-pay

## 2021-01-24 ENCOUNTER — Ambulatory Visit
Admission: RE | Admit: 2021-01-24 | Discharge: 2021-01-24 | Disposition: A | Discharge status: Home / Self Care | Payer: POS | Source: Ambulatory Visit | Attending: Family Medicine | Admitting: Family Medicine

## 2021-01-24 DIAGNOSIS — Z1231 Encounter for screening mammogram for malignant neoplasm of breast: Secondary | ICD-10-CM

## 2021-02-28 ENCOUNTER — Encounter (HOSPITAL_COMMUNITY): Payer: Self-pay

## 2021-02-28 ENCOUNTER — Ambulatory Visit (HOSPITAL_COMMUNITY)
Admission: EM | Admit: 2021-02-28 | Discharge: 2021-02-28 | Disposition: A | Discharge status: Home / Self Care | Payer: POS | Attending: Emergency Medicine | Admitting: Emergency Medicine

## 2021-02-28 ENCOUNTER — Other Ambulatory Visit: Payer: Self-pay

## 2021-02-28 DIAGNOSIS — U071 COVID-19: Secondary | ICD-10-CM | POA: Diagnosis not present

## 2021-02-28 DIAGNOSIS — R059 Cough, unspecified: Secondary | ICD-10-CM | POA: Insufficient documentation

## 2021-02-28 DIAGNOSIS — J069 Acute upper respiratory infection, unspecified: Secondary | ICD-10-CM | POA: Insufficient documentation

## 2021-02-28 LAB — SARS CORONAVIRUS 2 (TAT 6-24 HRS): SARS Coronavirus 2: POSITIVE — AB

## 2021-02-28 NOTE — Discharge Instructions (Signed)

## 2021-02-28 NOTE — ED Triage Notes (Signed)
Pt presents with non productive cough, congestion, nasal drainage, fatigue, and generalized body aches X 3 days.

## 2021-02-28 NOTE — ED Provider Notes (Signed)
[place]    CSN: [id number] Arrival date & time: 02/28/21  0807      History   Chief Complaint Chief Complaint  Patient presents with   URI    HPI Samantha Lloyd is a 55 y.o. female.   Patient here for evaluation of cough, congestion, fatigue, and body aches that has been ongoing for the past several days.  Reports symptoms started Sunday evening/Monday morning.  Denies any known recent sick contacts.  Reports taking at home COVID test on Monday that was negative. Denies any trauma, injury, or other precipitating event.  Denies any specific alleviating or aggravating factors.  Denies any fevers, chest pain, shortness of breath, N/V/D, numbness, tingling, weakness, abdominal pain, or headaches.    The history is provided by the patient.  URI Presenting symptoms: congestion, cough and fatigue   Presenting symptoms: no sore throat   Associated symptoms: myalgias    History reviewed. No pertinent past medical history.  Patient Active Problem List   Diagnosis Date Noted   Stress incontinence in female 01/07/2021   Nocturnal polyuria 01/07/2021   Insomnia 05/26/2019   Fatigue 10/22/2016   Acrochordon 12/22/2015   Loss of weight 12/22/2015   Hot flashes 06/27/2011   Leiomyoma of uterus 09/27/2008    History reviewed. No pertinent surgical history.  OB History   No obstetric history on file.      Home Medications    Prior to Admission medications   Medication Sig Start Date End Date Taking? Authorizing Provider  FLUoxetine (PROZAC) 20 MG tablet Take 1 tablet (20 mg total) by mouth daily. 07/28/20   Matilde Haymaker, MD  ibuprofen (ADVIL) 800 MG tablet TAKE 1 TABLET BY MOUTH EVERY 8 HOURS AS NEEDED 06/15/19   Edrick Kins, DPM  Melatonin 1 MG TABS TAKE 1 CAPSULE (1 MG TOTAL) BY MOUTH AT BEDTIME. 06/09/19   Guadalupe Dawn, MD  Multiple Vitamins-Minerals (CENTRUM ADULTS PO) Take by mouth daily.    [provider]    Family History Family  History  Problem Relation Age of Onset   Breast cancer Neg Hx     Social History Social History   Tobacco Use   Smoking status: Never   Smokeless tobacco: Never     Allergies   Patient has no known allergies.   Review of Systems Review of Systems  Constitutional:  Positive for fatigue.  HENT:  Positive for congestion. Negative for sore throat.   Respiratory:  Positive for cough. Negative for chest tightness and shortness of breath.   Musculoskeletal:  Positive for myalgias.  All other systems reviewed and are negative.   Physical Exam Triage Vital Signs ED Triage Vitals  Enc Vitals Group     BP 02/28/21 0843 111/68     Pulse Rate 02/28/21 0843 87     Resp 02/28/21 0843 17     Temp 02/28/21 0843 98.4 F (36.9 C)     Temp Source 02/28/21 0843 Oral     SpO2 02/28/21 0843 97 %     Weight --      Height --      Head Circumference --      Peak Flow --      Pain Score 02/28/21 0844 6     Pain Loc --      Pain Edu? --      Excl. in [place]? --    No data found.  Updated Vital Signs BP 111/68 (BP Location: Right Arm)  Pulse 87   Temp 98.4 F (36.9 C) (Oral)   Resp 17   LMP  (LMP Unknown)   SpO2 97%   Visual Acuity Right Eye Distance:   Left Eye Distance:   Bilateral Distance:    Right Eye Near:   Left Eye Near:    Bilateral Near:     Physical Exam Vitals and nursing note reviewed.  Constitutional:      General: She is not in acute distress.    Appearance: Normal appearance. She is not ill-appearing, toxic-appearing or diaphoretic.  HENT:     Head: Normocephalic and atraumatic.     Nose: Congestion present.     Right Turbinates: Swollen.     Left Turbinates: Swollen.     Right Sinus: No maxillary sinus tenderness or frontal sinus tenderness.     Left Sinus: No maxillary sinus tenderness or frontal sinus tenderness.     Mouth/Throat:     Pharynx: Uvula midline.     Tonsils: No tonsillar exudate or tonsillar abscesses. 0 on the right. 0 on the left.   Eyes:     Conjunctiva/sclera: Conjunctivae normal.  Cardiovascular:     Rate and Rhythm: Normal rate.     Pulses: Normal pulses.     Heart sounds: Normal heart sounds.  Pulmonary:     Effort: Pulmonary effort is normal.     Breath sounds: Normal breath sounds.  Abdominal:     General: Abdomen is flat.  Musculoskeletal:        General: Normal range of motion.     Cervical back: Normal range of motion.  Skin:    General: Skin is warm and dry.  Neurological:     General: No focal deficit present.     Mental Status: She is alert and oriented to person, place, and time.  Psychiatric:        Mood and Affect: Mood normal.     UC Treatments / Results  Labs (all labs ordered are listed, but only abnormal results are displayed) Labs Reviewed  SARS CORONAVIRUS 2 (TAT 6-24 HRS)    EKG   Radiology No results found.  Procedures Procedures (including critical care time)  Medications Ordered in UC Medications - No data to display  Initial Impression / Assessment and Plan / UC Course  I have reviewed the triage vital signs and the nursing notes.  Pertinent labs & imaging results that were available during my care of the patient were reviewed by me and considered in my medical decision making (see chart for details).    Assessment negative for red flags or concerns.  We will test for COVID here as it is possible her at home test was an accurate or that she tested too soon.  COVID test pending.  Discussed that patient needs to quarantine while waiting for results and for at least 5 days from symptom onset if test is positive.  May take Tylenol and/or ibuprofen as needed for pain and fevers.  Encourage fluids and rest.  Discussed conservative symptom management as described in discharge instructions.  Follow-up with primary care as needed Final Clinical Impressions(s) / UC Diagnoses   Final diagnoses:  Viral upper respiratory tract infection     Discharge Instructions       We will contact you if your COVID test is positive.  Please quarantine while you wait for the results.  If your test is negative you may resume normal activities.  If your test is positive please continue to  quarantine for at least 5 days from your symptom onset or until you are without a fever for at least 24 hours after the medications.  You can take Tylenol and/or Ibuprofen as needed for fever reduction and pain relief.   For cough: honey 1/2 to 1 teaspoon (you can dilute the honey in water or another fluid).  You can also use guaifenesin and dextromethorphan for cough. You can use a humidifier for chest congestion and cough.  If you don't have a humidifier, you can sit in the bathroom with the hot shower running.     For sore throat: try warm salt water gargles, cepacol lozenges, throat spray, warm tea or water with lemon/honey, popsicles or ice, or OTC cold relief medicine for throat discomfort.    For congestion: take a daily anti-histamine like Zyrtec, Claritin, and a oral decongestant, such as pseudoephedrine.  You can also use Flonase 1-2 sprays in each nostril daily.    It is important to stay hydrated: drink plenty of fluids (water, gatorade/powerade/pedialyte, juices, or teas) to keep your throat moisturized and help further relieve irritation/discomfort.   Return or go to the Emergency Department if symptoms worsen or do not improve in the next few days.      ED Prescriptions   None    PDMP not reviewed this encounter.   Pearson Forster, NP 02/28/21 [phone]

## 2021-03-01 ENCOUNTER — Ambulatory Visit: Payer: POS

## 2021-03-01 ENCOUNTER — Telehealth: Payer: Self-pay

## 2021-03-01 NOTE — Telephone Encounter (Signed)
Patient calls nurse line stating she tested positive for covid at [facility] yesterday. Patient reports mild cough, congestion, and mild SOB. Patient started having symptoms on Sunday and went UC yesterday. Patient is requesting antivirals. Will forward to PCP.

## 2021-07-30 ENCOUNTER — Ambulatory Visit: Payer: POS

## 2021-07-31 ENCOUNTER — Ambulatory Visit (INDEPENDENT_AMBULATORY_CARE_PROVIDER_SITE_OTHER): Payer: POS | Admitting: Family Medicine

## 2021-07-31 ENCOUNTER — Other Ambulatory Visit: Payer: Self-pay

## 2021-07-31 ENCOUNTER — Ambulatory Visit (INDEPENDENT_AMBULATORY_CARE_PROVIDER_SITE_OTHER): Payer: POS

## 2021-07-31 VITALS — BP 110/60 | HR 81 | Ht 64.0 in | Wt 154.5 lb

## 2021-07-31 DIAGNOSIS — Z23 Encounter for immunization: Secondary | ICD-10-CM

## 2021-07-31 DIAGNOSIS — M75101 Unspecified rotator cuff tear or rupture of right shoulder, not specified as traumatic: Secondary | ICD-10-CM

## 2021-07-31 MED ORDER — MELOXICAM 7.5 MG PO TABS: 7.5000 mg | ORAL_TABLET | Freq: Every day | ORAL | 0 refills | Status: DC

## 2021-07-31 NOTE — Patient Instructions (Signed)
You have right shoulder rotator cuff syndrome.  Please take the meloxicam daily to calm down the inflammation. Remember the range of motion exercises to prevent frozen shoulder. Remember the back strengthening exercises to open up the shoulders.  Your mamma was right: Good posture helps your shoulders. This can be chronic.  See [place] in 2 weeks if still hurting.  Next steps would be a physical therapy referral and/or a cortisone injection.

## 2021-08-01 NOTE — Progress Notes (Signed)
° ° °  SUBJECTIVE:   CHIEF COMPLAINT / HPI:   55 yo female who works out regularly, including weight training with right shoulder pain x 2 months.  No fever.  Not immunocompromised and otherwise healthy.  Not pain, cannot lay on the right shoulder.  Difficulty raising right arm.  No neck pain.  No numbness, tingling or skin changes.    OBJECTIVE:   BP 110/60    Pulse 81    Ht 5\' 4"  (1.626 m)    Wt 154 lb 8 oz (70.1 kg)    LMP  (LMP Unknown)    SpO2 97%    BMI 26.52 kg/m   FROM of neck Abducts shoulder to only 90 degrees.  + empty can test.  Normal strength, sensation, reflexes and pulses.  ASSESSMENT/PLAN:   Rotator cuff syndrome of right shoulder Discussed ROM exercises to prevent frozen shoulder.  Given regular dose NSAID.  FU one week.  If not improving, consider PT or steroid injection.     Zenia Resides, MD [place]

## 2021-08-01 NOTE — Assessment & Plan Note (Signed)
Discussed ROM exercises to prevent frozen shoulder.  Given regular dose NSAID.  FU one week.  If not improving, consider PT or steroid injection.

## 2021-08-14 ENCOUNTER — Encounter: Payer: Self-pay | Admitting: Family Medicine

## 2021-08-14 ENCOUNTER — Other Ambulatory Visit: Payer: Self-pay

## 2021-08-14 ENCOUNTER — Other Ambulatory Visit (HOSPITAL_COMMUNITY)
Admission: RE | Admit: 2021-08-14 | Discharge: 2021-08-14 | Disposition: A | Discharge status: Home / Self Care | Payer: POS | Source: Ambulatory Visit | Attending: Family Medicine | Admitting: Family Medicine

## 2021-08-14 ENCOUNTER — Ambulatory Visit (INDEPENDENT_AMBULATORY_CARE_PROVIDER_SITE_OTHER): Payer: POS | Admitting: Family Medicine

## 2021-08-14 VITALS — BP 100/75 | HR 68 | Ht 64.0 in | Wt 159.0 lb

## 2021-08-14 DIAGNOSIS — Z113 Encounter for screening for infections with a predominantly sexual mode of transmission: Secondary | ICD-10-CM

## 2021-08-14 DIAGNOSIS — Z124 Encounter for screening for malignant neoplasm of cervix: Secondary | ICD-10-CM | POA: Insufficient documentation

## 2021-08-14 DIAGNOSIS — Z23 Encounter for immunization: Secondary | ICD-10-CM | POA: Diagnosis not present

## 2021-08-14 DIAGNOSIS — Z0000 Encounter for general adult medical examination without abnormal findings: Secondary | ICD-10-CM

## 2021-08-14 DIAGNOSIS — M75101 Unspecified rotator cuff tear or rupture of right shoulder, not specified as traumatic: Secondary | ICD-10-CM

## 2021-08-14 LAB — POCT WET PREP (WET MOUNT)
Clue Cells Wet Prep Whiff POC: NEGATIVE
Trichomonas Wet Prep HPF POC: ABSENT

## 2021-08-14 NOTE — Patient Instructions (Signed)
It was great seeing you today!  Today you came in for your physical exam and we also took care of your pap smear and STI testing along with your Tdap. We checked your blood work and I will call you if any of your work up is abnormal otherwise you will get a letter if normal.   Keep up the good work with your exercise!   I will refill the Meloxicam for your shoulder pain in January when your refill is due. Return if the pain worsens.   I will see you next year for your annual physical but if you need anything before then just give [place] a call!   Feel free to call with any questions or concerns at any time, at [phone].   Take care,  Dr. Shary Key [facility]

## 2021-08-14 NOTE — Progress Notes (Signed)
° ° °  SUBJECTIVE:   Chief compliant/HPI: annual examination  Samantha Lloyd is a 55 y.o. who presents today for an annual exam.   Patient states she is doing well, though she realizes she has gained weight in the past year. Understands it is diet and portion control. Doesn't walk as much as she used to but exercises regularly- combination of aerobics and weights.   Endorses R shoulder pain improved with Meloxicam  Due for pap. Requests STI testing as well. Denies vaginal symptoms No discharge, no odor. No urinary symptoms  Drinks 1 or 2 glasses of alcohol a week Denies tobacco or marijuana use   OBJECTIVE:   BP 100/75    Pulse 68    Ht 5\' 4"  (1.626 m)    Wt 159 lb (72.1 kg)    LMP  (LMP Unknown)    SpO2 100%    BMI 27.29 kg/m    Physical exam General: well appearing, NAD Cardiovascular: RRR, no murmurs Lungs: CTAB. Normal WOB Abdomen: soft, non-distended, non-tender Skin: warm, dry. No edema Pelvic exam: normal external genitalia, vulva, vagina, cervix, uterus and adnexa.    ASSESSMENT/PLAN:   No problem-specific Assessment & Plan notes found for this encounter.    Annual Examination  See AVS for age appropriate recommendations  PHQ score 4, reviewed and discussed.  BP reviewed and at goal .  No concern regarding intimate partner violence   Considered the following items based upon USPSTF recommendations: Diabetes screening:  recommend obtaining at next visit  Screening for elevated cholesterol: discussed and ordered HIV testing: discussed and ordered Hepatitis C:  not indicated, previously screened  Syphilis if at high risk: discussed and ordered GC/CT  ordered Reviewed risk factors for latent tuberculosis and not indicated  Cervical cancer screening: due for Pap today, cytology + HPV ordered Breast cancer screening:  up to date Colorectal cancer screening: up to date on screening for CRC. Vaccinations Tdap.   Follow up in 1 year or sooner if indicated.     [place]

## 2021-08-15 LAB — CBC
Hematocrit: 36.1 % (ref 34.0–46.6)
Hemoglobin: 12 g/dL (ref 11.1–15.9)
MCH: 28.7 pg (ref 26.6–33.0)
MCHC: 33.2 g/dL (ref 31.5–35.7)
MCV: 86 fL (ref 79–97)
Platelets: 237 10*3/uL (ref 150–450)
RBC: 4.18 x10E6/uL (ref 3.77–5.28)
RDW: 12.1 % (ref 11.7–15.4)
WBC: 4.8 10*3/uL (ref 3.4–10.8)

## 2021-08-15 LAB — BASIC METABOLIC PANEL
BUN/Creatinine Ratio: 20 (ref 9–23)
BUN: 18 mg/dL (ref 6–24)
CO2: 25 mmol/L (ref 20–29)
Calcium: 10.1 mg/dL (ref 8.7–10.2)
Chloride: 102 mmol/L (ref 96–106)
Creatinine, Ser: 0.89 mg/dL (ref 0.57–1.00)
Glucose: 75 mg/dL (ref 70–99)
Potassium: 4.3 mmol/L (ref 3.5–5.2)
Sodium: 140 mmol/L (ref 134–144)
eGFR: 76 mL/min/{1.73_m2} (ref >59)

## 2021-08-15 LAB — LIPID PANEL
Chol/HDL Ratio: 3.1 ratio (ref 0.0–4.4)
Cholesterol, Total: 231 mg/dL — ABNORMAL HIGH (ref 100–199)
HDL: 75 mg/dL (ref >39)
LDL Chol Calc (NIH): 145 mg/dL — ABNORMAL HIGH (ref 0–99)
Triglycerides: 63 mg/dL (ref 0–149)
VLDL Cholesterol Cal: 11 mg/dL (ref 5–40)

## 2021-08-15 LAB — RPR: RPR Ser Ql: NONREACTIVE

## 2021-08-15 LAB — HIV ANTIBODY (ROUTINE TESTING W REFLEX): HIV Screen 4th Generation wRfx: NONREACTIVE

## 2021-08-21 LAB — CYTOLOGY - PAP
Chlamydia: NEGATIVE
Comment: NEGATIVE
Comment: NEGATIVE
Comment: NORMAL
Diagnosis: UNDETERMINED — AB
High risk HPV: POSITIVE — AB
Neisseria Gonorrhea: NEGATIVE

## 2021-08-22 ENCOUNTER — Telehealth: Payer: Self-pay | Admitting: Family Medicine

## 2021-08-22 NOTE — Telephone Encounter (Signed)
Called patient to discuss results from recent visit with Dr. Arby Barrette.  Informed her of normal CBC, BMP, wet prep, and STD testing.  Also discussed lipid profile which shows elevated total cholesterol (231) and LDL (145). However, patient's ASCVD risk is only 1.4% using the following values: female, age 57, african american, total cholesterol 830, HDL 75, systolic BP 735, no hx of HTN, smoking, or diabetes. Given ASCVD risk of 1.4%, no indication for statin therapy at this time. She will work on dietary modifications and continue regular exercise.  Finally, discussed abnormal pap results which show ASCUS with positive high risk HPV. ASCCP guidelines recommend proceeding with colposcopy. Patient is agreeable and was scheduled in colpo clinic on 09/06/21 at 8:50am.  All questions answered.   Alcus Dad, MD PGY-2 [place]

## 2021-08-27 ENCOUNTER — Other Ambulatory Visit: Payer: Self-pay | Admitting: Family Medicine

## 2021-08-27 DIAGNOSIS — M75101 Unspecified rotator cuff tear or rupture of right shoulder, not specified as traumatic: Secondary | ICD-10-CM

## 2021-09-06 ENCOUNTER — Other Ambulatory Visit: Payer: Self-pay

## 2021-09-06 ENCOUNTER — Ambulatory Visit (INDEPENDENT_AMBULATORY_CARE_PROVIDER_SITE_OTHER): Payer: POS | Admitting: Family Medicine

## 2021-09-06 VITALS — BP 104/66 | HR 88 | Wt 155.0 lb

## 2021-09-06 DIAGNOSIS — R87610 Atypical squamous cells of undetermined significance on cytologic smear of cervix (ASC-US): Secondary | ICD-10-CM

## 2021-09-06 DIAGNOSIS — R87810 Cervical high risk human papillomavirus (HPV) DNA test positive: Secondary | ICD-10-CM

## 2021-09-06 NOTE — Progress Notes (Signed)
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 No prior abnormals. Hx uterine fibroid embolization  2011 MRI showing fibroids and adenonyosis  01/2010: Moderately enlarged uterus with diffuse involvement by  fibroids, measuring up to 4 cm.  The vast majority of these  fibroids show contrast enhancement, and no pedunculated  intracavitary or subserosal fibroids identified.  2.  Adenomyosis involving the posterior uterine wall.  3.  No evidence of adnexal mass.    Patient given informed consent, signed copy in the chart.  Placed in lithotomy position. Cervix viewed with speculum and colposcope after application of acetic acid.   Colposcopy adequate (entire squamocolumnar junctions seen  in entirety) ?  yes Acetowhite lesions?no Punctation?no Mosaicism?  no Abnormal vasculature?  no Biopsies?none needed ECC?no Complications? none  COMMENTS: Patient was given post procedure instructions.

## 2021-09-06 NOTE — Patient Instructions (Signed)
We did a colposcopy today in clinic.  This was to follow-up your mildly abnormal Pap smear.  We saw no abnormalities on the colposcopy so the recommendation is that you have a Pap smear in 1 year.  You may have a mildly abnormal Pap for a year or 2 and end up having a colposcopy every time you have an abnormal Pap for the next couple of years.  This is not unusual.  We did not do any biopsies so there are no restrictions.  You should not have any new symptoms.  Please call us with any questions.

## 2021-10-12 ENCOUNTER — Other Ambulatory Visit: Payer: Self-pay | Admitting: Family Medicine

## 2021-10-12 DIAGNOSIS — M75101 Unspecified rotator cuff tear or rupture of right shoulder, not specified as traumatic: Secondary | ICD-10-CM

## 2022-02-13 ENCOUNTER — Ambulatory Visit (INDEPENDENT_AMBULATORY_CARE_PROVIDER_SITE_OTHER): Payer: POS | Admitting: Family Medicine

## 2022-02-13 VITALS — BP 112/68 | HR 83 | Temp 99.1°F | Wt 147.6 lb

## 2022-02-13 DIAGNOSIS — B349 Viral infection, unspecified: Secondary | ICD-10-CM

## 2022-02-13 NOTE — Progress Notes (Signed)
    SUBJECTIVE:   CHIEF COMPLAINT / HPI:   Not feeling well -Symptoms started yesterday evening -Feeling warm, sore throat, cough, tired, body aches -Negative home COVID test; vaccinated x3 -No known sick contacts -Taking theraflu -Denies nausea, emesis, abdominal pain, diarrhea  PERTINENT  PMH / PSH: Hot flashes  OBJECTIVE:   BP 112/68   Pulse 83   Temp 99.1 F (37.3 C) (Oral)   Wt 147 lb 9.6 oz (67 kg)   LMP  (LMP Unknown)   SpO2 96%   BMI 25.34 kg/m   Physical Exam Vitals reviewed.  Constitutional:      General: She is not in acute distress.    Appearance: She is ill-appearing. She is not toxic-appearing or diaphoretic.  HENT:     Head: Normocephalic.     Right Ear: Tympanic membrane normal.     Left Ear: Tympanic membrane normal.     Nose: No congestion or rhinorrhea.     Mouth/Throat:     Pharynx: No oropharyngeal exudate or posterior oropharyngeal erythema.  Eyes:     Conjunctiva/sclera: Conjunctivae normal.  Neck:     Comments: Submandibular lymph node bilaterally about 0.5-1 cm each Cardiovascular:     Rate and Rhythm: Normal rate and regular rhythm.     Heart sounds: Normal heart sounds.  Pulmonary:     Effort: Pulmonary effort is normal.     Breath sounds: Normal breath sounds.  Lymphadenopathy:     Cervical: Cervical adenopathy present.  Neurological:     Mental Status: She is alert and oriented to person, place, and time.  Psychiatric:        Mood and Affect: Mood normal.        Behavior: Behavior normal.    ASSESSMENT/PLAN:   Viral illness <24 hour of illness. Low grade temp today. Negative home COVID test. Lymphadenopathy likely reactive to current illness. Suspicion for flu is lower and POC test not available today. Discussed supportive treatment and repeat home test in 2-4 days. Return precautions given.   Gerlene Fee, [place]

## 2022-02-13 NOTE — Patient Instructions (Addendum)
It was wonderful to see you today.  Please bring ALL of your medications with you to every visit.   Today we talked about:  -Honey, robitussin, or tea with lemon for sore throat -Motrin for temp of 100.4 or greater -Retest for COVID in 2-3 days  If you haven't already, sign up for My Chart to have easy access to your labs results, and communication with your primary care physician.  Please call the clinic at [phone] if your symptoms worsen or you have any concerns. It was our pleasure to serve you.  Dr. Janus Molder

## 2022-03-14 NOTE — Progress Notes (Signed)
    SUBJECTIVE:   CHIEF COMPLAINT / HPI:  Chief Complaint  Patient presents with   foot pain    Patient concerned about darkening of the toenails of the first and second toes of bilateral feet.  She reports she ran a [place] race about 2 months ago.  Was wearing narrow shoes.  Had a lot of pain after the race which is now resolved, but now has this discoloration of the toenails.  Toenail of the right great toe fell off earlier this week.  She is concerned about fungal infection.  PERTINENT  PMH / PSH: Noncontributory  Patient Care Team: Shary Key, DO as PCP - General (Family Medicine)   OBJECTIVE:   BP 112/71   Pulse 62   Ht '5\' 4"'$  (1.626 m)   Wt 148 lb 9.6 oz (67.4 kg)   LMP  (LMP Unknown)   SpO2 100%   BMI 25.51 kg/m   Physical Exam Constitutional:      General: She is not in acute distress. Cardiovascular:     Rate and Rhythm: Normal rate.  Pulmonary:     Effort: Pulmonary effort is normal. No respiratory distress.  Musculoskeletal:     Cervical back: Neck supple.  Skin:    Comments: Dark brown discoloration beneath the toenail of the right first and second toes and left first and second toes consistent with subungual hematoma.  There is missing nail of the right great toe with thin layer of new nail growth.  Neurological:     Mental Status: She is alert.         03/15/2022    1:59 PM  Depression screen PHQ 2/9  Decreased Interest 0  Down, Depressed, Hopeless 0  PHQ - 2 Score 0  Altered sleeping 3  Tired, decreased energy 0  Change in appetite 0  Feeling bad or failure about yourself  0  Trouble concentrating 0  Moving slowly or fidgety/restless 0  Suicidal thoughts 0  PHQ-9 Score 3     {Show previous vital signs (optional):23777}    ASSESSMENT/PLAN:   Subungual hematoma of toes of bilateral feet Affecting the first and second toes of the left and right feet.  Related to recent race and ill fitting shoes causing repetitive toe trauma.  Does  not appear like fungal infection. - reassurance provided, nails should return to normal in the next few months  HCM - advised to get shingles vaccine at pharmacy  Return in about 5 months (around 08/15/2022), or if symptoms worsen or fail to improve, for physical.   Zola Button, MD [place]

## 2022-03-14 NOTE — Patient Instructions (Addendum)
It was nice seeing you today!  Your toenails should return to normal in the next several weeks to months.  Get your shingles vaccine at the pharmacy.  Stay well, Zola Button, MD [place] [phone]  --  Make sure to check out at the front desk before you leave today.  Please arrive at least 15 minutes prior to your scheduled appointments.  If you had blood work today, I will send you a MyChart message or a letter if results are normal. Otherwise, I will give you a call.  If you had a referral placed, they will call you to set up an appointment. Please give [place] a call if you don't hear back in the next 2 weeks.  If you need additional refills before your next appointment, please call your pharmacy first.

## 2022-03-15 ENCOUNTER — Ambulatory Visit (INDEPENDENT_AMBULATORY_CARE_PROVIDER_SITE_OTHER): Payer: Self-pay | Admitting: Family Medicine

## 2022-03-15 VITALS — BP 112/71 | HR 62 | Ht 64.0 in | Wt 148.6 lb

## 2022-03-15 DIAGNOSIS — S90221A Contusion of right lesser toe(s) with damage to nail, initial encounter: Secondary | ICD-10-CM

## 2022-03-15 DIAGNOSIS — S90222A Contusion of left lesser toe(s) with damage to nail, initial encounter: Secondary | ICD-10-CM

## 2022-07-16 ENCOUNTER — Ambulatory Visit (INDEPENDENT_AMBULATORY_CARE_PROVIDER_SITE_OTHER): Payer: Self-pay | Admitting: Family Medicine

## 2022-07-16 ENCOUNTER — Encounter: Payer: Self-pay | Admitting: Family Medicine

## 2022-07-16 VITALS — BP 105/71 | HR 74 | Temp 98.1°F | Wt 154.5 lb

## 2022-07-16 DIAGNOSIS — R051 Acute cough: Secondary | ICD-10-CM | POA: Diagnosis not present

## 2022-07-16 DIAGNOSIS — B349 Viral infection, unspecified: Secondary | ICD-10-CM

## 2022-07-16 LAB — POCT INFLUENZA A/B
Influenza A, POC: NEGATIVE
Influenza B, POC: NEGATIVE

## 2022-07-16 NOTE — Patient Instructions (Addendum)
It was nice seeing you today!  Make sure you drink plenty of fluids to stay hydrated.  For cough I recommend honey.  Stay well, Zola Button, MD [place] [phone]  --  Make sure to check out at the front desk before you leave today.  Please arrive at least 15 minutes prior to your scheduled appointments.  If you had blood work today, I will send you a MyChart message or a letter if results are normal. Otherwise, I will give you a call.  If you had a referral placed, they will call you to set up an appointment. Please give [place] a call if you don't hear back in the next 2 weeks.  If you need additional refills before your next appointment, please call your pharmacy first.

## 2022-07-16 NOTE — Progress Notes (Signed)
    SUBJECTIVE:   CHIEF COMPLAINT / HPI:  Chief Complaint  Patient presents with   Nasal Congestion    Patient started feeling ill 2 days ago with cough, congestion, and diarrhea. She has had 4-5 episodes of diarrhea yesterday and today. She has had some chills and headaches as well as decreased energy. Denies fever.  PERTINENT  PMH / PSH: Noncontributory  Patient Care Team: Shary Key, DO as PCP - General (Family Medicine)   OBJECTIVE:   BP (!) 108/53   Pulse 74   Temp 98.1 F (36.7 C) (Oral)   Wt 154 lb 8 oz (70.1 kg)   LMP  (LMP Unknown)   SpO2 98%   BMI 26.52 kg/m   Physical Exam Constitutional:      General: She is not in acute distress. HENT:     Head: Normocephalic and atraumatic.     Nose: Congestion present.     Mouth/Throat:     Mouth: Mucous membranes are moist.     Pharynx: Oropharynx is clear. No oropharyngeal exudate or posterior oropharyngeal erythema.  Cardiovascular:     Rate and Rhythm: Normal rate and regular rhythm.  Pulmonary:     Effort: Pulmonary effort is normal. No respiratory distress.     Breath sounds: Normal breath sounds.  Abdominal:     Palpations: Abdomen is soft.     Comments: Mild diffuse tenderness  Musculoskeletal:     Cervical back: Neck supple.  Lymphadenopathy:     Cervical: No cervical adenopathy.  Neurological:     Mental Status: She is alert.         07/16/2022    1:27 PM  Depression screen PHQ 2/9  Decreased Interest 0  Down, Depressed, Hopeless 0  PHQ - 2 Score 0  Altered sleeping 0  Tired, decreased energy 0  Change in appetite 0  Feeling bad or failure about yourself  0  Trouble concentrating 0  Moving slowly or fidgety/restless 0  Suicidal thoughts 0  PHQ-9 Score 0     {Show previous vital signs (optional):23777}    ASSESSMENT/PLAN:   Viral illness Patient presenting with viral prodrome symptoms for about 2 days with associated diarrhea.  Doubt bacterial infection. - supportive care -  Covid test - influenza A/B negative  Return if symptoms worsen or fail to improve.   Zola Button, MD [place]

## 2022-07-17 LAB — SPECIMEN STATUS REPORT

## 2022-07-17 LAB — NOVEL CORONAVIRUS, NAA: SARS-CoV-2, NAA: NOT DETECTED

## 2022-07-18 NOTE — Progress Notes (Signed)
Informed pt that her covid test came back negative and that she can return back to work.

## 2022-07-22 ENCOUNTER — Telehealth: Payer: Self-pay

## 2022-07-22 NOTE — Telephone Encounter (Signed)
Patient calls nurse line regarding scheduling follow up pap smear. She was advised after colposcopy in January to have repeat pap in one year. Patient scheduled with PCP for 08/06/22. She wanted to make sure that this was okay or does she need to wait until after January for follow up.   Will forward to PCP.   Talbot Grumbling, RN

## 2022-07-24 NOTE — Telephone Encounter (Signed)
Called patient and advised of message from provider. No further questions at this time.   Talbot Grumbling, RN

## 2022-08-06 ENCOUNTER — Other Ambulatory Visit: Payer: Self-pay | Admitting: Family Medicine

## 2022-08-06 ENCOUNTER — Encounter: Payer: Self-pay | Admitting: Family Medicine

## 2022-08-06 ENCOUNTER — Ambulatory Visit (INDEPENDENT_AMBULATORY_CARE_PROVIDER_SITE_OTHER): Payer: Managed Care, Other (non HMO) | Admitting: Family Medicine

## 2022-08-06 ENCOUNTER — Other Ambulatory Visit (HOSPITAL_COMMUNITY)
Admission: RE | Admit: 2022-08-06 | Discharge: 2022-08-06 | Disposition: A | Discharge status: Home / Self Care | Payer: Self-pay | Source: Ambulatory Visit | Attending: Family Medicine | Admitting: Family Medicine

## 2022-08-06 VITALS — BP 103/60 | HR 72 | Ht 64.0 in | Wt 158.0 lb

## 2022-08-06 DIAGNOSIS — Z124 Encounter for screening for malignant neoplasm of cervix: Secondary | ICD-10-CM | POA: Diagnosis not present

## 2022-08-06 DIAGNOSIS — Z113 Encounter for screening for infections with a predominantly sexual mode of transmission: Secondary | ICD-10-CM

## 2022-08-06 DIAGNOSIS — Z1231 Encounter for screening mammogram for malignant neoplasm of breast: Secondary | ICD-10-CM

## 2022-08-06 NOTE — Progress Notes (Signed)
     SUBJECTIVE:   CHIEF COMPLAINT / HPI:   Vaginal Discharge: Patient is a 56 y.o. female presenting for pap smear and STI check. Was HPV positive 1 year ago. Denies vaginal or urinary symptoms. Does endorse 1 new partner. LMP 10 years ago  PERTINENT  PMH / PSH: Reviewed   OBJECTIVE:   BP 103/60   Pulse 72   Ht '5\' 4"'$  (1.626 m)   Wt 158 lb (71.7 kg)   LMP  (LMP Unknown)   SpO2 100%   BMI 27.12 kg/m    General: NAD, pleasant, able to participate in exam Respiratory: Normal effort, no obvious respiratory distress Pelvic: VULVA: normal appearing vulva with no masses, tenderness or lesions, VAGINA: Normal appearing vagina with normal color, no lesions, with clear and white discharge present, CERVIX: No lesions, clear and white discharge present,  Chaperone [facility]  present for pelvic exam  ASSESSMENT/PLAN:   No problem-specific Assessment & Plan notes found for this encounter.    Assessment:  56 y.o. female presents for pap smear and STI check. Last pap 1 year ago  abnormal with ASCUS and positive high risk HPV resulting in colpo without biopsies. Denies vaginal symptoms. Physical exam significant for clear white discharge.  Plan: -Pap smear  -GC/chlamydia pending -Will check HIV and [place]

## 2022-08-06 NOTE — Patient Instructions (Signed)
It was great seeing you today!  We did your pap smear and STI testing. I will call if anything is abnormal or will send a mychart message if normal.   Feel free to call with any questions or concerns at any time, at [phone].   Take care,  Dr. Shary Key [facility]

## 2022-08-07 LAB — HIV ANTIBODY (ROUTINE TESTING W REFLEX): HIV Screen 4th Generation wRfx: NONREACTIVE

## 2022-08-07 LAB — RPR: RPR Ser Ql: NONREACTIVE

## 2022-08-13 LAB — CYTOLOGY - PAP
Chlamydia: NEGATIVE
Comment: NEGATIVE
Comment: NEGATIVE
Comment: NEGATIVE
Comment: NORMAL
Diagnosis: UNDETERMINED — AB
High risk HPV: NEGATIVE
Neisseria Gonorrhea: NEGATIVE
Trichomonas: NEGATIVE

## 2022-08-21 ENCOUNTER — Encounter: Payer: Self-pay | Admitting: Family Medicine

## 2022-08-21 ENCOUNTER — Ambulatory Visit (INDEPENDENT_AMBULATORY_CARE_PROVIDER_SITE_OTHER): Payer: Self-pay | Admitting: Family Medicine

## 2022-08-21 VITALS — BP 98/64 | HR 71 | Temp 98.6°F | Wt 158.1 lb

## 2022-08-21 DIAGNOSIS — B349 Viral infection, unspecified: Secondary | ICD-10-CM

## 2022-08-21 NOTE — Progress Notes (Signed)
    SUBJECTIVE:   CHIEF COMPLAINT / HPI:   Cough - Since Friday night - Hoarseness and burning cough now - Has runny nose and sneezing with some achiness - No known fevers, sinus pressure, headaches, belly pain - Not around any children regularly  - Never smoker  PERTINENT  PMH / PSH: Reviewed  OBJECTIVE:   BP 98/64   Pulse 71   Temp 98.6 F (37 C) (Oral)   Wt 158 lb 2 oz (71.7 kg)   LMP  (LMP Unknown)   BMI 27.14 kg/m   Gen: well-appearing, NAD CV: RRR, no m/r/g appreciated, no peripheral edema Pulm: CTAB, no wheezes/crackles GI: soft, non-tender, non-distended HEENT: TMs clear bilaterally, no oropharyngeal erythema, no tonsillar exudates, no cervical lymphadenopathy or sinus tenderness upon palpation  ASSESSMENT/PLAN:   Viral illness Patient's exam and history seem more consistent with a viral illness.  Has had some improvement in her symptoms and do not feel that pneumonia is a concern at this time, but will need to closely monitor. - Reviewed supportive measurements for illness - Follow-up if no improvement over the weekend - Note provided for work  Rise Patience, [place]

## 2022-08-21 NOTE — Patient Instructions (Signed)
I think we are dealing with a viral illness, I recommend using over the counter medications such as Robitussin and Mucinex for your symptoms. Honey can help with cough and if you have any sore throat issues. I anticipate that this will get better over the next few days. If by next week you have not had any improvement please make sure to check in and get evaluated again.

## 2022-09-06 ENCOUNTER — Ambulatory Visit: Payer: Self-pay

## 2022-09-30 ENCOUNTER — Ambulatory Visit: Payer: Self-pay

## 2022-10-02 ENCOUNTER — Ambulatory Visit (INDEPENDENT_AMBULATORY_CARE_PROVIDER_SITE_OTHER): Payer: BC Managed Care – PPO | Admitting: Family Medicine

## 2022-10-02 VITALS — BP 100/80 | HR 94 | Temp 98.6°F | Ht 64.0 in | Wt 151.0 lb

## 2022-10-02 DIAGNOSIS — K529 Noninfective gastroenteritis and colitis, unspecified: Secondary | ICD-10-CM | POA: Diagnosis not present

## 2022-10-02 MED ORDER — ONDANSETRON HCL 4 MG PO TABS: 4.0000 mg | ORAL_TABLET | Freq: Three times a day (TID) | ORAL | 0 refills | Status: DC | PRN

## 2022-10-02 NOTE — Progress Notes (Signed)
    SUBJECTIVE:   CHIEF COMPLAINT / HPI:   Patient has been having diarrhea after lunch, she ended up eating half of her lunch and felt miserable the rest of the day. Feeling nauseous at the time as well. She ended up vomiting early the next morning. Had some [place] but has eaten before and did not have issues in the past. She ordered a dish with orange sauce which she has had in the past and did not have issues. Denies hematemesis and hematochezia, bottom feels sore. Took Pepto-Bismol that caused her to have 2 more episodes of diarrhea. She is able to tolerate minimal food, was able to eat a yogurt this morning that stayed down. Yesterday she made fresh kale and baked chicken which she was able to tolerate but then later had an episode of diarrhea. Denies fever but endorses chills initially. This has not occurred recently. Denies cough, congestion and rhinorrhea. Endorsing some generalized abdominal pain. Last vomiting episode was yesterday. Last diarrhea episode was earlier this morning.   OBJECTIVE:   BP 100/80   Pulse 94   Temp 98.6 F (37 C)   Ht '5\' 4"'$  (1.626 m)   Wt 151 lb (68.5 kg)   LMP  (LMP Unknown)   SpO2 100%   BMI 25.92 kg/m   General: Patient well-appearing, in no acute distress. HEENT: moist mucous membranes CV: RRR, no murmurs or gallops auscultated Resp: CTAB, no wheezing, rales or rhonchi noted Abdomen: soft, nontender upon deep palpation, nondistended, presence of bowel sounds Ext: capillary refill less than 2 sec, radial pulses strong and equal bilaterally   ASSESSMENT/PLAN:   Gastroenteritis -given history, symptoms likely consistent with viral gastroenteritis. Patient does not have history of IBS although this was considered and would be very rare to develop this now. Reassuringly patient's does not appear dehydrated -supportive care measures discussed and reassurance provided -short course of zofran prescribed  -encouraged hydration and eating bland diet  with gradual advancing of diet -work note provided -strict ED precautions discussed -follow up as appropriate      Donney Dice, [place]

## 2022-10-02 NOTE — Assessment & Plan Note (Signed)
-  given history, symptoms likely consistent with viral gastroenteritis. Patient does not have history of IBS although this was considered and would be very rare to develop this now. Reassuringly patient's does not appear dehydrated -supportive care measures discussed and reassurance provided -short course of zofran prescribed  -encouraged hydration and eating bland diet with gradual advancing of diet -work note provided -strict ED precautions discussed -follow up as appropriate

## 2022-10-02 NOTE — Patient Instructions (Addendum)
It was great seeing you today!  Today we discussed your symptoms, it seems like you had food poisoning or gastroenteritis from the recent food you had. I am glad that you are able to tolerate fluids. Please make sure to stay very hydrated, drinking gatorade can help to replace the electrolytes that you are losing from the vomiting and diarrhea. I have prescribed zofran to take when you feel nauseous. Please eat bland foods like crackers and then gradually advance your diet. If you are still having more episodes of diarrhea by tonight then you may get over the counter imodium.   If you are not tolerating fluids then I worry about dehydration, please go to the emergency department if this occurs so that you can get IV fluids.   Please follow up at your next scheduled appointment, if anything arises between now and then, please don't hesitate to contact our office.   Thank you for allowing [place] to be a part of your medical care!  Thank you, Dr. Larae Grooms  Also a reminder of our clinic's no-show policy. Please make sure to arrive at least 15 minutes prior to your scheduled appointment time. Please try to cancel before 24 hours if you are not able to make it. If you no-show for 2 appointments then you will be receiving a warning letter. If you no-show after 3 visits, then you may be at risk of being dismissed from our clinic. This is to ensure that everyone is able to be seen in a timely manner. Thank you, we appreciate your assistance with this!

## 2022-11-13 ENCOUNTER — Ambulatory Visit (INDEPENDENT_AMBULATORY_CARE_PROVIDER_SITE_OTHER): Payer: BC Managed Care – PPO | Admitting: Family Medicine

## 2022-11-13 ENCOUNTER — Encounter: Payer: Self-pay | Admitting: Family Medicine

## 2022-11-13 VITALS — BP 100/58 | HR 83 | Ht 64.0 in | Wt 153.0 lb

## 2022-11-13 DIAGNOSIS — R5383 Other fatigue: Secondary | ICD-10-CM | POA: Diagnosis not present

## 2022-11-13 NOTE — Progress Notes (Signed)
[facility]    SUBJECTIVE:   CHIEF COMPLAINT / HPI:   Patient states that for the past couple of months has been experiencing aching legs at night and also with concern for fatigue. After sitting down when she gets up she feels stiff. Sometimes has some cramping at night.  Thought maybe its from working out. Does take a daily multivitamin.Also States she is more tired, has been gaining weight without eating much different. Can't sleep, feels more stressed because working more. Tries to go to bed at 9/10  but takes a while to fall asleep, and wakes up at 5am. Toss and turns a lot. Drinks caffeine all day.   PERTINENT  PMH / PSH: Reviewed   OBJECTIVE:   BP (!) 100/58   Pulse 83   Ht 5\' 4"  (1.626 m)   Wt 153 lb (69.4 kg)   LMP  (LMP Unknown)   SpO2 97%   BMI 26.26 kg/m    Physical exam General: well appearing, NAD Cardiovascular: RRR, no murmurs Lungs: CTAB. Normal WOB Abdomen: soft, non-distended, non-tender Skin: warm, dry. No edema   ASSESSMENT/PLAN:   Fatigue Patient endorses feeling more tired. PHQ9 score of 8 and without SI/HI. Mood could be contributing with stress from work. Discussed sleep hygiene at length as I think that is greatly contributing. Recommended not laying in bed for hours trying to fall asleep but doing something/reading/avoiding TV or phone, etc. Discussed reduction in caffeine and avoiding after noon. Discussed healthy diet. Recommended keeping a sleep journal. Will also obtain some labs to rule out potential causes of fatigue such as anemia, thyroid, etc. At  follow up can discuss possibly adjusting SSRi - CBC, BMP, Vit B12, TSH   B12, TSH  Shary Key, [place]

## 2022-11-13 NOTE — Patient Instructions (Signed)
It was great seeing you today!  You came in for leg stiffness and feeling fatigue.  This is likely related to arthritis, and I recommend continuing to exercise and stretch during the day.  For the fatigue we are checking some blood work, I will call you if anything is abnormal or we will send a MyChart message if normal.  I suspect that sleep hygiene is playing a role, so as we discussed I recommend avoiding caffeine in the afternoon, avoiding screens before bed, avoiding laying in bed for long time without sleeping and avoiding exercising late in the day.  Also recommend watching diet, eating more fruits and vegetables, eating less foods with sugar, processed, and foods high in fat.  You can keep a log of when you fall asleep and what you were doing in the evening, and bring that in at follow-up if not improved with the things we discussed.   Feel free to call with any questions or concerns at any time, at [phone].   Take care,  Dr. Shary Key [facility]

## 2022-11-14 LAB — BASIC METABOLIC PANEL
BUN/Creatinine Ratio: 26 — ABNORMAL HIGH (ref 9–23)
BUN: 18 mg/dL (ref 6–24)
CO2: 25 mmol/L (ref 20–29)
Calcium: 10.5 mg/dL — ABNORMAL HIGH (ref 8.7–10.2)
Chloride: 102 mmol/L (ref 96–106)
Creatinine, Ser: 0.69 mg/dL (ref 0.57–1.00)
Glucose: 77 mg/dL (ref 70–99)
Potassium: 4.9 mmol/L (ref 3.5–5.2)
Sodium: 140 mmol/L (ref 134–144)
eGFR: 101 mL/min/{1.73_m2} (ref >59)

## 2022-11-14 LAB — CBC
Hematocrit: 39.5 % (ref 34.0–46.6)
Hemoglobin: 13.1 g/dL (ref 11.1–15.9)
MCH: 29.2 pg (ref 26.6–33.0)
MCHC: 33.2 g/dL (ref 31.5–35.7)
MCV: 88 fL (ref 79–97)
Platelets: 266 10*3/uL (ref 150–450)
RBC: 4.49 x10E6/uL (ref 3.77–5.28)
RDW: 12.2 % (ref 11.7–15.4)
WBC: 4.3 10*3/uL (ref 3.4–10.8)

## 2022-11-14 LAB — VITAMIN B12: Vitamin B-12: 1353 pg/mL — ABNORMAL HIGH (ref 232–1245)

## 2022-11-14 LAB — TSH RFX ON ABNORMAL TO FREE T4: TSH: 0.964 u[IU]/mL (ref 0.450–4.500)

## 2022-11-15 NOTE — Assessment & Plan Note (Signed)
Patient endorses feeling more tired. PHQ9 score of 8 and without SI/HI. Mood could be contributing with stress from work. Discussed sleep hygiene at length as I think that is greatly contributing. Recommended not laying in bed for hours trying to fall asleep but doing something/reading/avoiding TV or phone, etc. Discussed reduction in caffeine and avoiding after noon. Discussed healthy diet. Recommended keeping a sleep journal. Will also obtain some labs to rule out potential causes of fatigue such as anemia, thyroid, etc. At  follow up can discuss possibly adjusting SSRi - CBC, BMP, Vit B12, TSH

## 2023-08-29 ENCOUNTER — Ambulatory Visit: Payer: BC Managed Care – PPO | Admitting: Family Medicine

## 2023-09-02 ENCOUNTER — Encounter: Payer: Self-pay | Admitting: Family Medicine

## 2023-09-02 ENCOUNTER — Other Ambulatory Visit (HOSPITAL_COMMUNITY)
Admission: RE | Admit: 2023-09-02 | Discharge: 2023-09-02 | Disposition: A | Discharge status: Home / Self Care | Payer: BC Managed Care – PPO | Source: Ambulatory Visit | Attending: Family Medicine | Admitting: Family Medicine

## 2023-09-02 ENCOUNTER — Ambulatory Visit (INDEPENDENT_AMBULATORY_CARE_PROVIDER_SITE_OTHER): Payer: BC Managed Care – PPO | Admitting: Family Medicine

## 2023-09-02 ENCOUNTER — Other Ambulatory Visit: Payer: Self-pay | Admitting: Family Medicine

## 2023-09-02 VITALS — BP 105/46 | HR 83 | Ht 64.0 in | Wt 170.8 lb

## 2023-09-02 DIAGNOSIS — L089 Local infection of the skin and subcutaneous tissue, unspecified: Secondary | ICD-10-CM | POA: Diagnosis not present

## 2023-09-02 DIAGNOSIS — R87610 Atypical squamous cells of undetermined significance on cytologic smear of cervix (ASC-US): Secondary | ICD-10-CM | POA: Diagnosis not present

## 2023-09-02 DIAGNOSIS — Z124 Encounter for screening for malignant neoplasm of cervix: Secondary | ICD-10-CM | POA: Insufficient documentation

## 2023-09-02 DIAGNOSIS — Z8742 Personal history of other diseases of the female genital tract: Secondary | ICD-10-CM | POA: Diagnosis not present

## 2023-09-02 MED ORDER — DOXYCYCLINE HYCLATE 100 MG PO TABS: 100.0000 mg | ORAL_TABLET | Freq: Two times a day (BID) | ORAL | 0 refills | Status: DC

## 2023-09-02 NOTE — Progress Notes (Addendum)
    SUBJECTIVE:   CHIEF COMPLAINT / HPI:   Patient is a 58 year old female presenting for Pap smear.  Last pap smear 07/2022 showed ASCUS with negative HPV, recommend repeat Pap in 1 year. Would like STI testing but declined blood work today.  Negative pain at tattoo site.  Got a tattoo on her left lower leg in November and noticed there is some swelling to the lower part of the tattoo.  Now has seen scab for the past month and it is painful.  Has not noted any discharge but does note color change.  Denies any fevers or chills  PERTINENT  PMH / PSH:  Abnormal Pap smear leiomyoma of uterus  OBJECTIVE:   BP (!) 105/46   Pulse 83   Ht 5' 4 (1.626 m)   Wt 170 lb 12.8 oz (77.5 kg)   LMP  (LMP Unknown)   SpO2 100%   BMI 29.32 kg/m   General: A&O, NAD CV: RRR Respiratory: CTAB   GU: Normal appearance of labia majora and minora, without lesions. Vagina tissue pink, moist, without lesions or abrasions. Cervix normal appearance, non-friable, without discharge from os.  Skin: tattoo on left lower lateral leg without erythema or swelling. Noted change in color and TTP at site. No discharge noted.    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  Repeated Pap smear after 1 year.  No obvious changes to cervix on physical exam.  No discharge noted.  Patient would also like STI testing at this time.  Denies any symptoms.  Skin infection Tattoo site concerning for cellulitis.  Given patient has had wound for about a month and is still is experiencing pain, will treat.  Will obtain x-ray to ensure no underlying infection.  Low concern for sepsis at this time given patient is well-appearing with vital signs within normal limits and afebrile. -Doxycycline  100 mg BID for 5 days - xray   Follow-up in 1 month to discuss results and for healthcare maintenance.  Gloriann Ogren, MD [facility]

## 2023-09-02 NOTE — Progress Notes (Signed)
 Called patient to discuss and pharmacy to adjust. Patient had not picked up prescription. Adjusted for 100mg  BID for 5 days. Patient expressed understanding.

## 2023-09-02 NOTE — Patient Instructions (Signed)
 It was great to see you today! Thank you for choosing [facility] for your primary care. Samantha Lloyd was seen for repeat pap smear.  Today we addressed: We completed your pap smear today. I will call or MyChart you with the results Your tattoo does look like it might be infected. I ordered an xray of that side just to be sure there isnt underlying infection. I also ordered antibiotics for you to take for a week. Take one pill TWICE a day for the first day, and then once daily for a total of 7 days.  Lets plan to follow up in 1 month since we are treating you for infection.   If you haven't already, sign up for My Chart to have easy access to your labs results, and communication with your primary care physician.  We are checking some labs today. If they are abnormal, I will call you. If they are normal, I will send you a MyChart message (if it is active) or a letter in the mail. If you do not hear about your labs in the next 2 weeks, please call the office.   You should return to our clinic Return in about 4 weeks (around 09/30/2023) for follow up cellulitis, meet PCP.  I recommend that you always bring your medications to each appointment as this makes it easy to ensure you are on the correct medications and helps us  not miss refills when you need them.  Please arrive 15 minutes before your appointment to ensure smooth check in process.  We appreciate your efforts in making this happen.  Please call the clinic at [phone] if your symptoms worsen or you have any concerns.  Thank you for allowing me to participate in your care, Gloriann Ogren, MD 09/02/2023, 4:47 PM PGY-1, [facility]

## 2023-09-02 NOTE — Assessment & Plan Note (Addendum)
 Tattoo site concerning for cellulitis.  Given patient has had wound for about a month and is still is experiencing pain, will treat.  Will obtain x-ray to ensure no underlying infection.  Low concern for sepsis at this time given patient is well-appearing with vital signs within normal limits and afebrile. -Doxycycline  100 mg BID for 5 days - xray

## 2023-09-02 NOTE — Addendum Note (Signed)
 Addended byPenne Lash on: 09/02/2023 05:38 PM   Modules accepted: Orders

## 2023-09-02 NOTE — Assessment & Plan Note (Signed)
 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  Repeated Pap smear after 1 year.  No obvious changes to cervix on physical exam.  No discharge noted.  Patient would also like STI testing at this time.  Denies any symptoms.

## 2023-09-05 ENCOUNTER — Telehealth: Payer: Self-pay | Admitting: Family Medicine

## 2023-09-05 LAB — CYTOLOGY - PAP
Chlamydia: NEGATIVE
Comment: NEGATIVE
Comment: NEGATIVE
Comment: NEGATIVE
Comment: NORMAL
Diagnosis: UNDETERMINED — AB
High risk HPV: NEGATIVE
Neisseria Gonorrhea: NEGATIVE
Trichomonas: NEGATIVE

## 2023-09-05 NOTE — Telephone Encounter (Signed)
Called patient to discuss results of Pap smear.  Discussed ASCUS without high risk HPV, and would recommend repeat Pap smear in 1 year.  Patient expressed understanding.  All questions answered.

## 2023-09-17 ENCOUNTER — Encounter: Payer: Self-pay | Admitting: Student

## 2023-09-17 ENCOUNTER — Ambulatory Visit (INDEPENDENT_AMBULATORY_CARE_PROVIDER_SITE_OTHER): Payer: BC Managed Care – PPO | Admitting: Student

## 2023-09-17 VITALS — BP 102/69 | HR 78 | Temp 98.2°F | Ht 64.0 in | Wt 169.0 lb

## 2023-09-17 DIAGNOSIS — B349 Viral infection, unspecified: Secondary | ICD-10-CM

## 2023-09-17 NOTE — Patient Instructions (Addendum)
It was great seeing you today.  You likely have a viral illness. I anticipate that this will get better over the next few days. If by next week you have not had any improvement please make sure to check in and get evaluated again.   As we discussed, - I recommend drinking plenty of fluids. Bone broth is a great option to get some protein in while staying hydrated - Honey for cough, 1 tablespoon at night - Mucinex is good for breaking up congestion   If you have any questions or concerns, please feel free to call the clinic.   Have a wonderful day,  Dr. Darral Dash [facility] [phone]

## 2023-09-17 NOTE — Progress Notes (Signed)
    SUBJECTIVE:   CHIEF COMPLAINT / HPI:   Samantha Lloyd is a 58 year-old female here with sick symptoms.  Started 2 days ago with loss of appetite, runny nose with yellow/green nasal congestion.  Then, she developed fatigue.  Denies headaches, nausea or vomiting.  She says she has had some looser stools but no overt diarrhea.  Tolerating p.o. fluids and food without difficulty.  She tested herself for COVID this morning at home and it was negative. 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  PERTINENT  PMH / PSH:  None pertinent  OBJECTIVE:   BP 102/69   Pulse 78   Temp 98.2 F (36.8 C) (Oral)   Ht 5\' 4"  (1.626 m)   Wt 169 lb (76.7 kg)   LMP  (LMP Unknown)   SpO2 100%   BMI 29.01 kg/m  which General: Pleasant, well-appearing, wears facial mask HEENT: Mucous membranes moist.  No oropharyngeal erythema. Cardiac: Regular rate and rhythm  Respiratory: Normal effort on room air with no wheezing or crackles Extremities: Warm and well-perfused  ASSESSMENT/PLAN:   Viral illness Constellation of 2 days nasal congestion, fatigue, cough most consistent with upper respiratory viral illness. Discussed symptomatic course and expectations with peak of symptoms typically on day 3-4, and supportive care with fluids, rest, Tylenol/ibuprofen as needed.  Honey and Mucinex for cough. No concern for pneumonia or other acute bacterial infection at this time. Follow-up in 1 week if no improvement, or worsening of symptoms.      Darral Dash, DO [facility]

## 2023-09-18 ENCOUNTER — Ambulatory Visit: Payer: BC Managed Care – PPO

## 2023-09-20 DIAGNOSIS — B349 Viral infection, unspecified: Secondary | ICD-10-CM | POA: Insufficient documentation

## 2023-09-20 NOTE — Assessment & Plan Note (Signed)
Constellation of 2 days nasal congestion, fatigue, cough most consistent with upper respiratory viral illness. Discussed symptomatic course and expectations with peak of symptoms typically on day 3-4, and supportive care with fluids, rest, Tylenol/ibuprofen as needed.  Honey and Mucinex for cough. No concern for pneumonia or other acute bacterial infection at this time. Follow-up in 1 week if no improvement, or worsening of symptoms.

## 2023-09-23 ENCOUNTER — Telehealth: Payer: Self-pay

## 2023-09-23 NOTE — Telephone Encounter (Signed)
 Patient calls nurse line regarding persistent cough and congestion. She reports that cough varies from dry to productive. At times, she is coughing up green/yellow phlegm. Denies fever or chills.   She was seen last week with Dr. Dartha on 09/17/23.  She states that she was told to return call to office this week if symptoms have not improved and that abx could possibly be sent in.   Requesting prescription to be sent to [facility] on [address].   Chiquita JAYSON English, RN

## 2023-09-25 NOTE — Telephone Encounter (Signed)
 Patient returns call to nurse line regarding concern.   Please advise.   Veronda Prude, RN

## 2023-09-29 MED ORDER — AZITHROMYCIN 250 MG PO TABS: ORAL_TABLET | ORAL | 0 refills | Status: DC

## 2023-10-24 ENCOUNTER — Ambulatory Visit: Payer: BC Managed Care – PPO | Admitting: Family Medicine

## 2023-10-24 ENCOUNTER — Encounter: Payer: Self-pay | Admitting: Family Medicine

## 2023-10-24 VITALS — BP 106/76 | HR 80 | Ht 64.0 in | Wt 170.4 lb

## 2023-10-24 DIAGNOSIS — Z0000 Encounter for general adult medical examination without abnormal findings: Secondary | ICD-10-CM | POA: Diagnosis not present

## 2023-10-24 LAB — POCT GLYCOSYLATED HEMOGLOBIN (HGB A1C): Hemoglobin A1C: 5.1 % (ref 4.0–5.6)

## 2023-10-24 NOTE — Patient Instructions (Signed)
 Today at your annual preventive visit we talked about the following measures: - We drew some labs including testing for cholesterol and diabetes, I will call or send you a MyChart message about these results. - You need a mammogram to prevent breast cancer.  Please schedule an appointment.  You can call [phone].  Make sure to wear two-piece clothing. No lotions, powders, or deodorants the day of the appointment. Make sure to bring picture ID and insurance card. Bring list of medications you are currently taking including any supplements.     Things to do to Keep yourself Healthy - Exercise at least 30-45 minutes a day, 3-4 days a week. >150 min of moderate intensity per week is advised. - Eat a low-fat diet with lots of fruits and vegetables, up to 7-9 servings per day. - Seatbelts can save your life. Wear them always. - Smoke detectors on every level of your home, check batteries every year. - Eye Doctor - have an eye exam every 1-2 years - Safe sex - if you may be exposed to STDs, use a condom. - Alcohol If you drink, do it moderately, less than 1 drink per day. - Health Care Power of Attorney.  Choose someone to speak for you if you are not able. - Depression is common in our stressful world.If you're feeling down or losing interest in things you normally enjoy, please come in for a visit. - Violence - If anyone is threatening or hurting you, please call immediately.   I recommend 150 minutes of exercise per week-try 30 minutes 5 days per week We discussed reducing sugary beverages (like soda and juice) and increasing leafy greens and whole fruits.  We discussed avoiding tobacco and alcohol.  I recommend avoiding illicit substances.    If you haven't already, sign up for My Chart to have easy access to your labs results, and communication with your primary care physician.  You should return to our clinic Return in about 1 year (around 10/23/2024).  I recommend that you always bring  your medications to each appointment as this makes it easy to ensure you are on the correct medications and helps [place] not miss refills when you need them.  Please arrive 15 minutes before your appointment to ensure smooth check in process.  We appreciate your efforts in making this happen.  Please call the clinic at [phone] if your symptoms worsen or you have any concerns.  Thank you for allowing me to participate in your care, Hal Morales, MD 10/24/2023, 4:13 PM PGY-1, [facility]

## 2023-10-24 NOTE — Progress Notes (Signed)
    SUBJECTIVE:   Chief compliant/HPI: annual examination  Samantha Lloyd is a 58 y.o. who presents today for an annual exam.   History tabs reviewed and updated.   Review of systems form reviewed and is not notable. .   OBJECTIVE:   BP 106/76   Pulse 80   Ht 5\' 4"  (1.626 m)   Wt 170 lb 6.4 oz (77.3 kg)   LMP  (LMP Unknown)   SpO2 100%   BMI 29.25 kg/m   General: A&O, NAD HEENT: No sign of trauma, EOM grossly intact Cardiac: RRR, no m/r/g Respiratory: CTAB, normal WOB, no w/c/r GI: Soft, NTTP, non-distended  Extremities: NTTP, no peripheral edema. Neuro: Normal gait, moves all four extremities appropriately. Psych: Appropriate mood and affect ASSESSMENT/PLAN:   Assessment & Plan Annual physical exam UTD on vaccinations. Ordered mammogram. UTD on screenings.  - Lipid panel and A1C ordered today - reviewed medications and PMHx    Annual Examination  See AVS for age appropriate recommendations  PHQ score 0 , reviewed and discussed.  BP reviewed and at goal.  Asked about intimate partner violence and resources given as appropriate  Advance directives discussion  Considered the following items based upon USPSTF recommendations: Diabetes screening: ordered Screening for elevated cholesterol: ordered HIV testing: discussed Hepatitis C: discussed Hepatitis B: discussed Syphilis if at high risk: no high risk  GC/CT not at high risk and not ordered. Osteoporosis screening considered- patient is <65 yo at average risk. Not ordered  Reviewed risk factors for latent tuberculosis and not indicated   Discussed family history, BRCA testing not indicated.  Cervical cancer screening: prior Pap reviewed, repeat due in 2026 Breast cancer screening: discussed potential benefits, risks including overdiagnosis and biopsy, elected proceed with mammogram- ordered  Colorectal cancer screening: up to date on screening for CRC. Lung cancer screening:  not indicated  . See  documentation below regarding indications/risks/benefits.  Vaccinations UTD.   Follow up in 1 year or sooner if indicated.    Hal Morales, MD [facility]

## 2023-10-25 LAB — LIPID PANEL
Chol/HDL Ratio: 3.1 ratio (ref 0.0–4.4)
Cholesterol, Total: 219 mg/dL — ABNORMAL HIGH (ref 100–199)
HDL: 71 mg/dL (ref >39)
LDL Chol Calc (NIH): 131 mg/dL — ABNORMAL HIGH (ref 0–99)
Triglycerides: 98 mg/dL (ref 0–149)
VLDL Cholesterol Cal: 17 mg/dL (ref 5–40)

## 2023-10-27 ENCOUNTER — Encounter: Payer: Self-pay | Admitting: Family Medicine

## 2024-01-30 ENCOUNTER — Ambulatory Visit

## 2024-03-20 ENCOUNTER — Ambulatory Visit
Admission: RE | Admit: 2024-03-20 | Discharge: 2024-03-20 | Disposition: A | Discharge status: Home / Self Care | Source: Ambulatory Visit | Attending: Family Medicine | Admitting: Family Medicine

## 2024-03-20 DIAGNOSIS — Z0000 Encounter for general adult medical examination without abnormal findings: Secondary | ICD-10-CM

## 2024-03-20 DIAGNOSIS — Z1231 Encounter for screening mammogram for malignant neoplasm of breast: Secondary | ICD-10-CM | POA: Diagnosis not present

## 2024-08-11 ENCOUNTER — Ambulatory Visit: Payer: Self-pay | Admitting: Family Medicine

## 2024-08-16 ENCOUNTER — Ambulatory Visit (INDEPENDENT_AMBULATORY_CARE_PROVIDER_SITE_OTHER): Admitting: Family Medicine

## 2024-08-16 ENCOUNTER — Encounter: Payer: Self-pay | Admitting: Family Medicine

## 2024-08-16 VITALS — BP 96/61 | HR 70 | Ht 64.0 in | Wt 167.6 lb

## 2024-08-16 DIAGNOSIS — E785 Hyperlipidemia, unspecified: Secondary | ICD-10-CM

## 2024-08-16 DIAGNOSIS — Z23 Encounter for immunization: Secondary | ICD-10-CM | POA: Diagnosis not present

## 2024-08-16 DIAGNOSIS — E663 Overweight: Secondary | ICD-10-CM | POA: Diagnosis not present

## 2024-08-16 LAB — POCT GLYCOSYLATED HEMOGLOBIN (HGB A1C): Hemoglobin A1C: 5.1 % (ref 4.0–5.6)

## 2024-08-16 NOTE — Progress Notes (Signed)
" ° ° °  SUBJECTIVE:   CHIEF COMPLAINT / HPI:   Discussed the use of AI scribe software for clinical note transcription with the patient, who gave verbal consent to proceed.  History of Present Illness Samantha Lloyd is a 58 year old female who presents for a follow-up on cholesterol levels and A1c check.  Sleep disturbance - Significant sleep disruption for the past three weeks since her mother with stage four colon cancer and liver metastasis moved in under hospice care - Mother's nocturnal wakefulness and daytime sleeping disrupts her rest - Hesitant to use stronger sleep aids due to need to remain alert to her mother's needs at night - not sure if she's interested in medicines, has not tried melatonin yet.   Weight management - Interested in GLP-1 medication for weight management - Willing to pay out of pocket for tirzepatide - has not been very consistent with lifestyle interventions. Interested in speaking with dietician or weight loss specialist   Metabolic and cardiovascular risk assessment - No diabetes, hypertension, or sleep apnea - No strong family history of heart disease - Does not smoke or use alcohol - No nocturnal dyspnea or other breathing problems at night     PERTINENT  PMH / PSH: HLD  OBJECTIVE:   BP 96/61   Pulse 70   Ht 5' 4 (1.626 m)   Wt 167 lb 9.6 oz (76 kg)   LMP  (LMP Unknown)   SpO2 98%   BMI 28.77 kg/m    General: NAD, pleasant, able to participate in exam Respiratory: No respiratory distress Skin: warm and dry, no rashes noted Psych: Normal affect and mood  ASSESSMENT/PLAN:     Assessment & Plan Hyperlipidemia, unspecified hyperlipidemia type Lipid panel earlier this year with a cholesterol 219 and LDL 131.  Stable/slightly improved compared to 3 years ago.  She would like to have this rechecked as well as her A1c, feel this is appropriate.  Her A1c is 5.1 which is reassuring. -Check lipid panel, her ASCVD risk is still relatively  low given that she does not have significant probability such as smoking, diabetes, hypertension.  Lifestyle interventions likely still most important for her unless her lipid panel shows very significant abnormalities.  We discussed this.  Referring to medical weight loss clinic as below Overweight (BMI 25.0-29.9) Patient interested in seeing weight loss clinic We discussed lifestyle interventions BMI of 28.  She is not diabetic.  She does not have hypertension.  No history to suggest OSA. Placed referral to medical weight management  Discussed melatonin for sleep issues. Explained its role in sleep cycle regulation and need for consistent use. - Recommended over-the-counter melatonin for sleep.  Payton Coward, MD [facility] "

## 2024-08-16 NOTE — Patient Instructions (Signed)
" °  VISIT SUMMARY: Today, we discussed your cholesterol levels, sleep disturbances, and weight management. We also reviewed your general health maintenance and ordered some tests to better understand your health status.  YOUR PLAN: HYPERLIPIDEMIA: You have slightly elevated cholesterol levels. -We ordered a lipid panel to check your cholesterol levels. -We also ordered an A1c test to monitor your blood sugar levels.  OVERWEIGHT: Your BMI is less than 30, and we discussed weight management options. -We referred you to a medical weight management clinic. -We discussed non-medical weight loss strategies, including diet and exercise.  SLEEP DISTURBANCE: You have been experiencing significant sleep disruption due to your mother's care needs. -We discussed the use of over-the-counter melatonin for sleep. It is important to use it consistently to help regulate your sleep cycle.                      Contains text generated by Abridge.                                 Contains text generated by Abridge.   "

## 2024-08-17 LAB — LIPID PANEL
Chol/HDL Ratio: 3 ratio (ref 0.0–4.4)
Cholesterol, Total: 216 mg/dL — ABNORMAL HIGH (ref 100–199)
HDL: 73 mg/dL
LDL Chol Calc (NIH): 131 mg/dL — ABNORMAL HIGH (ref 0–99)
Triglycerides: 68 mg/dL (ref 0–149)
VLDL Cholesterol Cal: 12 mg/dL (ref 5–40)

## 2024-08-18 ENCOUNTER — Ambulatory Visit: Payer: Self-pay | Admitting: Student

## 2024-08-24 ENCOUNTER — Encounter (INDEPENDENT_AMBULATORY_CARE_PROVIDER_SITE_OTHER): Payer: Self-pay

## 2024-08-30 ENCOUNTER — Encounter (INDEPENDENT_AMBULATORY_CARE_PROVIDER_SITE_OTHER): Payer: Self-pay

## 2024-09-02 ENCOUNTER — Encounter (INDEPENDENT_AMBULATORY_CARE_PROVIDER_SITE_OTHER): Payer: Self-pay

## 2024-09-03 ENCOUNTER — Ambulatory Visit (INDEPENDENT_AMBULATORY_CARE_PROVIDER_SITE_OTHER): Payer: Self-pay | Admitting: Family Medicine

## 2024-09-03 ENCOUNTER — Encounter: Payer: Self-pay | Admitting: Family Medicine

## 2024-09-03 ENCOUNTER — Other Ambulatory Visit (HOSPITAL_COMMUNITY)
Admission: RE | Admit: 2024-09-03 | Discharge: 2024-09-03 | Disposition: A | Source: Ambulatory Visit | Attending: Family Medicine | Admitting: Family Medicine

## 2024-09-03 VITALS — BP 98/65 | HR 75 | Ht 64.0 in | Wt 166.2 lb

## 2024-09-03 DIAGNOSIS — Z8742 Personal history of other diseases of the female genital tract: Secondary | ICD-10-CM | POA: Diagnosis present

## 2024-09-03 DIAGNOSIS — Z780 Asymptomatic menopausal state: Secondary | ICD-10-CM | POA: Insufficient documentation

## 2024-09-03 DIAGNOSIS — E785 Hyperlipidemia, unspecified: Secondary | ICD-10-CM

## 2024-09-03 NOTE — Patient Instructions (Addendum)
 It was wonderful to see you today.  Please bring ALL of your medications with you to every visit.   Today we talked about:  We did your pap smear today.   I referred you to a nutritionist   Thank you for choosing [facility].   Please call [phone] with any questions about today's appointment.  Please arrive at least 15 minutes prior to your scheduled appointments.   If you had blood work today, I will send you a MyChart message or a letter if results are normal. Otherwise, I will give you a call.   If you had a referral placed, they will call you to set up an appointment. Please give us  a call if you don't hear back in the next 2 weeks.   If you need additional refills before your next appointment, please call your pharmacy first.   Do you need your medications delivered to your home?   Well send your prescription to the [facility] for delivery.          Address: [address] [place], [place], [place] [address]          Phone: [phone]  Please call the [facility] to speak with a pharmacist and set up your home medication delivery. If you have any questions, feel free to contact us  -- were happy to help!  Other [facility] that offer affordable prices on both prescriptions and over-the-counter items, as well as convenient services like vaccinations, are  [facility], at [facility]         Address:  [address], [place], [place] [address]         Phone: [phone]  [facility], located in the [facility]        Address: [address] [place], [place] [address]        Phone: [phone]  [facility], at [facility]       Address: [address], [place], [place] [address]       Phone: [phone]  [facility], at [facility]       Address: [address], [place], [place] [address]       Phone: [phone]  You should follow up in our clinic in 3-4 months   Gloriann Ogren, MD Family Medicine

## 2024-09-03 NOTE — Assessment & Plan Note (Signed)
 Last pap smear 2025 showed ASCUS without HPV. Will complete pap today. Denies any symptoms of discharge or pain

## 2024-09-03 NOTE — Progress Notes (Signed)
" ° ° °  SUBJECTIVE:   CHIEF COMPLAINT / HPI:   Here for pap smear   PERTINENT  PMH / PSH: hx of ASCUS without high risk HPV  OBJECTIVE:   BP 98/65   Pulse 75   Ht 5' 4 (1.626 m)   Wt 166 lb 3.2 oz (75.4 kg)   LMP  (LMP Unknown)   SpO2 97%   BMI 28.53 kg/m   General: A&O, NAD HEENT: No sign of trauma, EOM grossly intact Respiratory: normal WOB GI: non-distended  Extremities: no peripheral edema. Neuro: Normal gait, moves all four extremities appropriately Skin: no lesions/rashes visualized Psych: Appropriate mood and affect GU: Normal appearance of labia majora and minora, without lesions. Vagina tissue pink, moist, without lesions or abrasions. Cervix normal appearance, non-friable, without discharge from os.    ASSESSMENT/PLAN:   Assessment & Plan History of abnormal cervical Pap smear Hx of ASCUS, last Pap was 2025 - repeat today     Gloriann Ogren, MD [facility] "

## 2024-09-03 NOTE — Assessment & Plan Note (Signed)
 Hx of ASCUS, last Pap was 2025 - repeat today

## 2024-09-03 NOTE — Progress Notes (Signed)
" ° ° °  SUBJECTIVE:   CHIEF COMPLAINT / HPI:   Reports here for pap and would like STD testing. Denies any symptoms or concerns for STI      Component Value Date/Time   DIAGPAP (A) 09/02/2023 1616    - Atypical squamous cells of undetermined significance (ASC-US )   DIAGPAP (A) 08/06/2022 1459    - Atypical squamous cells of undetermined significance (ASC-US )   DIAGPAP (A) 08/14/2021 1525    - Atypical squamous cells of undetermined significance (ASC-US )   HPVHIGH Negative 09/02/2023 1616   HPVHIGH Negative 08/06/2022 1459   HPVHIGH Positive (A) 08/14/2021 1525   ADEQPAP  09/02/2023 1616    Satisfactory for evaluation; transformation zone component PRESENT.   ADEQPAP  08/06/2022 1459    Satisfactory for evaluation; transformation zone component PRESENT.   ADEQPAP  08/14/2021 1525    Satisfactory for evaluation; transformation zone component PRESENT.   Also would like to discuss nutrition referral for weight gain and difficulty managing lipids.    OBJECTIVE:   BP 98/65   Pulse 75   Ht 5' 4 (1.626 m)   Wt 166 lb 3.2 oz (75.4 kg)   LMP  (LMP Unknown)   SpO2 97%   BMI 28.53 kg/m   General: A&O, NAD HEENT: No sign of trauma, EOM grossly intact Respiratory: normal WOB GI: non-distended  Extremities: no peripheral edema. Neuro: Normal gait, moves all four extremities appropriately Skin: no lesions/rashes visualized Psych: Appropriate mood and affect GU: Normal appearance of labia majora and minora, without lesions. Vagina tissue pink, moist, without lesions or abrasions. Cervix normal appearance, non-friable, without discharge from os.    ASSESSMENT/PLAN:   Assessment & Plan History of abnormal cervical Pap smear Last pap smear 2025 showed ASCUS without HPV. Will complete pap today. Denies any symptoms of discharge or pain  Hyperlipidemia, unspecified hyperlipidemia type Trying to manage diet but would like to see nutritionist for help, Will send referral      Gloriann Ogren, MD [facility] "

## 2024-09-06 LAB — CYTOLOGY - PAP
Adequacy: ABSENT
Chlamydia: NEGATIVE
Comment: NEGATIVE
Comment: NEGATIVE
Comment: NEGATIVE
Comment: NORMAL
Diagnosis: UNDETERMINED — AB
High risk HPV: NEGATIVE
Neisseria Gonorrhea: NEGATIVE
Trichomonas: NEGATIVE

## 2024-09-07 ENCOUNTER — Ambulatory Visit: Payer: Self-pay | Admitting: Family Medicine
